# Patient Record
Sex: Male | Born: 1970
Health system: Southern US, Community
[De-identification: ages and names within clinical notes are randomized; demographics above are authoritative.]

## PROBLEM LIST (undated history)

## (undated) DIAGNOSIS — E291 Testicular hypofunction: Secondary | ICD-10-CM

## (undated) DIAGNOSIS — K5900 Constipation, unspecified: Secondary | ICD-10-CM

## (undated) DIAGNOSIS — E7800 Pure hypercholesterolemia, unspecified: Secondary | ICD-10-CM

## (undated) DIAGNOSIS — M549 Dorsalgia, unspecified: Secondary | ICD-10-CM

## (undated) DIAGNOSIS — I10 Essential (primary) hypertension: Secondary | ICD-10-CM

## (undated) DIAGNOSIS — F419 Anxiety disorder, unspecified: Secondary | ICD-10-CM

## (undated) DIAGNOSIS — K5790 Diverticulosis of intestine, part unspecified, without perforation or abscess without bleeding: Secondary | ICD-10-CM

## (undated) DIAGNOSIS — G4730 Sleep apnea, unspecified: Secondary | ICD-10-CM

## (undated) DIAGNOSIS — K648 Other hemorrhoids: Secondary | ICD-10-CM

## (undated) DIAGNOSIS — R609 Edema, unspecified: Secondary | ICD-10-CM

## (undated) HISTORY — DX: Essential (primary) hypertension: I10

## (undated) HISTORY — DX: Sleep apnea, unspecified: G47.30

## (undated) HISTORY — DX: Testicular hypofunction: E29.1

## (undated) HISTORY — DX: Edema, unspecified: R60.9

## (undated) HISTORY — DX: Dorsalgia, unspecified: M54.9

## (undated) HISTORY — DX: Constipation, unspecified: K59.00

## (undated) HISTORY — DX: Diverticulosis of intestine, part unspecified, without perforation or abscess without bleeding: K57.90

## (undated) HISTORY — PX: NO PAST SURGERIES: SHX2092

## (undated) HISTORY — DX: Pure hypercholesterolemia, unspecified: E78.00

## (undated) HISTORY — DX: Other hemorrhoids: K64.8

---

## 2015-11-19 DIAGNOSIS — E559 Vitamin D deficiency, unspecified: Secondary | ICD-10-CM | POA: Diagnosis not present

## 2015-11-19 DIAGNOSIS — I10 Essential (primary) hypertension: Secondary | ICD-10-CM | POA: Diagnosis not present

## 2015-11-19 DIAGNOSIS — B3782 Candidal enteritis: Secondary | ICD-10-CM | POA: Diagnosis not present

## 2015-11-19 DIAGNOSIS — E7210 Disorders of sulfur-bearing amino-acid metabolism, unspecified: Secondary | ICD-10-CM | POA: Diagnosis not present

## 2015-12-17 DIAGNOSIS — R5383 Other fatigue: Secondary | ICD-10-CM | POA: Diagnosis not present

## 2015-12-17 DIAGNOSIS — E291 Testicular hypofunction: Secondary | ICD-10-CM | POA: Diagnosis not present

## 2015-12-17 DIAGNOSIS — I10 Essential (primary) hypertension: Secondary | ICD-10-CM | POA: Diagnosis not present

## 2016-02-19 DIAGNOSIS — E291 Testicular hypofunction: Secondary | ICD-10-CM | POA: Diagnosis not present

## 2016-02-19 DIAGNOSIS — K589 Irritable bowel syndrome without diarrhea: Secondary | ICD-10-CM | POA: Diagnosis not present

## 2016-02-19 DIAGNOSIS — I10 Essential (primary) hypertension: Secondary | ICD-10-CM | POA: Diagnosis not present

## 2016-04-07 ENCOUNTER — Encounter (HOSPITAL_BASED_OUTPATIENT_CLINIC_OR_DEPARTMENT_OTHER): Payer: Self-pay | Admitting: *Deleted

## 2016-04-07 ENCOUNTER — Emergency Department (HOSPITAL_BASED_OUTPATIENT_CLINIC_OR_DEPARTMENT_OTHER)
Admission: EM | Admit: 2016-04-07 | Discharge: 2016-04-07 | Disposition: A | Discharge status: Home / Self Care | Payer: BLUE CROSS/BLUE SHIELD | Attending: Emergency Medicine | Admitting: Emergency Medicine

## 2016-04-07 ENCOUNTER — Emergency Department (HOSPITAL_BASED_OUTPATIENT_CLINIC_OR_DEPARTMENT_OTHER): Payer: BLUE CROSS/BLUE SHIELD

## 2016-04-07 DIAGNOSIS — R0602 Shortness of breath: Secondary | ICD-10-CM | POA: Diagnosis not present

## 2016-04-07 DIAGNOSIS — R079 Chest pain, unspecified: Secondary | ICD-10-CM | POA: Insufficient documentation

## 2016-04-07 DIAGNOSIS — R200 Anesthesia of skin: Secondary | ICD-10-CM | POA: Insufficient documentation

## 2016-04-07 DIAGNOSIS — I10 Essential (primary) hypertension: Secondary | ICD-10-CM | POA: Diagnosis not present

## 2016-04-07 DIAGNOSIS — R42 Dizziness and giddiness: Secondary | ICD-10-CM | POA: Diagnosis not present

## 2016-04-07 DIAGNOSIS — I517 Cardiomegaly: Secondary | ICD-10-CM | POA: Diagnosis not present

## 2016-04-07 DIAGNOSIS — Z79899 Other long term (current) drug therapy: Secondary | ICD-10-CM | POA: Insufficient documentation

## 2016-04-07 DIAGNOSIS — R000 Tachycardia, unspecified: Secondary | ICD-10-CM | POA: Diagnosis not present

## 2016-04-07 DIAGNOSIS — R0789 Other chest pain: Secondary | ICD-10-CM | POA: Diagnosis not present

## 2016-04-07 HISTORY — DX: Anxiety disorder, unspecified: F41.9

## 2016-04-07 HISTORY — DX: Essential (primary) hypertension: I10

## 2016-04-07 LAB — COMPREHENSIVE METABOLIC PANEL
ALBUMIN: 4.4 g/dL (ref 3.5–5.0)
ALK PHOS: 62 U/L (ref 38–126)
ALT: 11 U/L — ABNORMAL LOW (ref 17–63)
ANION GAP: 10 (ref 5–15)
AST: 23 U/L (ref 15–41)
BUN: 27 mg/dL — ABNORMAL HIGH (ref 6–20)
CO2: 25 mmol/L (ref 22–32)
Calcium: 9.1 mg/dL (ref 8.9–10.3)
Chloride: 104 mmol/L (ref 101–111)
Creatinine, Ser: 0.99 mg/dL (ref 0.61–1.24)
GFR calc Af Amer: >60 mL/min (ref >60)
GFR calc non Af Amer: >60 mL/min (ref >60)
Glucose, Bld: 115 mg/dL — ABNORMAL HIGH (ref 65–99)
POTASSIUM: 3.7 mmol/L (ref 3.5–5.1)
SODIUM: 139 mmol/L (ref 135–145)
Total Bilirubin: 2.4 mg/dL — ABNORMAL HIGH (ref 0.3–1.2)
Total Protein: 7.3 g/dL (ref 6.5–8.1)

## 2016-04-07 LAB — CBC
HCT: 47.7 % (ref 39.0–52.0)
HEMOGLOBIN: 16.1 g/dL (ref 13.0–17.0)
MCH: 31.3 pg (ref 26.0–34.0)
MCHC: 33.8 g/dL (ref 30.0–36.0)
MCV: 92.6 fL (ref 78.0–100.0)
Platelets: 204 10*3/uL (ref 150–400)
RBC: 5.15 MIL/uL (ref 4.22–5.81)
RDW: 12.4 % (ref 11.5–15.5)
WBC: 9.8 10*3/uL (ref 4.0–10.5)

## 2016-04-07 LAB — D-DIMER, QUANTITATIVE: D-Dimer, Quant: 0.32 ug/mL-FEU (ref 0.00–0.50)

## 2016-04-07 LAB — TROPONIN I

## 2016-04-07 NOTE — ED Provider Notes (Signed)
[place] [facility] Provider Note   CSN: [id number] Arrival date & time: 04/07/16  0920     History   Chief Complaint Chief Complaint  Patient presents with  . Chest Pain    HPI Kevin Newman is a 46 y.o. male.  Patient referred in from urgent care for a one-week history of left-sided chest pain that radiates to the left arm with some numbness. Not made worse by movement or exertion. No history of similar pain. Pain is present at all times. Associated with some lightheadedness but no nausea no vomiting. Also some mild shortness of breath.      Past Medical History:  Diagnosis Date  . Anxiety   . Hypertension     There are no active problems to display for this patient.   History reviewed. No pertinent surgical history.     Home Medications    Prior to Admission medications   Medication Sig Start Date End Date Taking? Authorizing Provider  amLODipine (NORVASC) 5 MG tablet Take 5 mg by mouth daily.   Yes Historical Provider, MD    Family History History reviewed. No pertinent family history.  Social History Social History  Substance Use Topics  . Smoking status: Never Smoker  . Smokeless tobacco: Not on file  . Alcohol use No     Allergies   Amoxil [amoxicillin] and Sulfa antibiotics   Review of Systems Review of Systems  Constitutional: Negative for fever.  HENT: Negative for congestion.   Eyes: Negative for redness.  Respiratory: Positive for shortness of breath.   Cardiovascular: Positive for chest pain.  Gastrointestinal: Negative for abdominal pain.  Genitourinary: Negative for dysuria.  Musculoskeletal: Negative for back pain.  Skin: Negative for rash.  Neurological: Positive for light-headedness and numbness.  Hematological: Does not bruise/bleed easily.  Psychiatric/Behavioral: Negative for confusion.     Physical Exam Updated Vital Signs BP 121/89   Pulse 96   Temp 98.5 F (36.9 C) (Oral)   Resp 24   Ht 5\' 8"  (1.727 m)    Wt 110.2 kg   SpO2 96%   BMI 36.95 kg/m   Physical Exam  Constitutional: He is oriented to person, place, and time. He appears well-developed and well-nourished. No distress.  HENT:  Head: Normocephalic and atraumatic.  Mouth/Throat: Oropharynx is clear and moist.  Eyes: Conjunctivae and EOM are normal. Pupils are equal, round, and reactive to light.  Neck: Normal range of motion. Neck supple.  Cardiovascular: Normal rate, regular rhythm and normal heart sounds.   Pulmonary/Chest: Effort normal and breath sounds normal. No respiratory distress. He exhibits no tenderness.  Abdominal: Soft. Bowel sounds are normal. There is no tenderness.  Musculoskeletal: Normal range of motion.  Neurological: He is alert and oriented to person, place, and time. No cranial nerve deficit or sensory deficit. He exhibits normal muscle tone. Coordination normal.  Skin: Skin is warm.  Nursing note and vitals reviewed.    ED Treatments / Results  Labs (all labs ordered are listed, but only abnormal results are displayed) Labs Reviewed  COMPREHENSIVE METABOLIC PANEL - Abnormal; Notable for the following:       Result Value   Glucose, Bld 115 (*)    BUN 27 (*)    ALT 11 (*)    Total Bilirubin 2.4 (*)    All other components within normal limits  TROPONIN I  CBC  D-DIMER, QUANTITATIVE (NOT AT [facility])   Results for orders placed or performed during the hospital encounter of 04/07/16  Troponin I  Result Value Ref Range   Troponin I <0.03 <0.03 ng/mL  CBC  Result Value Ref Range   WBC 9.8 4.0 - 10.5 K/uL   RBC 5.15 4.22 - 5.81 MIL/uL   Hemoglobin 16.1 13.0 - 17.0 g/dL   HCT 47.7 39.0 - 52.0 %   MCV 92.6 78.0 - 100.0 fL   MCH 31.3 26.0 - 34.0 pg   MCHC 33.8 30.0 - 36.0 g/dL   RDW 12.4 11.5 - 15.5 %   Platelets 204 150 - 400 K/uL  Comprehensive metabolic panel  Result Value Ref Range   Sodium 139 135 - 145 mmol/L   Potassium 3.7 3.5 - 5.1 mmol/L   Chloride 104 101 - 111 mmol/L   CO2 25 22 -  32 mmol/L   Glucose, Bld 115 (H) 65 - 99 mg/dL   BUN 27 (H) 6 - 20 mg/dL   Creatinine, Ser 0.99 0.61 - 1.24 mg/dL   Calcium 9.1 8.9 - 10.3 mg/dL   Total Protein 7.3 6.5 - 8.1 g/dL   Albumin 4.4 3.5 - 5.0 g/dL   AST 23 15 - 41 U/L   ALT 11 (L) 17 - 63 U/L   Alkaline Phosphatase 62 38 - 126 U/L   Total Bilirubin 2.4 (H) 0.3 - 1.2 mg/dL   GFR calc non Af Amer >60 >60 mL/min   GFR calc Af Amer >60 >60 mL/min   Anion gap 10 5 - 15  D-dimer, quantitative (not at [facility])  Result Value Ref Range   D-Dimer, Quant 0.32 0.00 - 0.50 ug/mL-FEU    EKG  EKG Interpretation  Date/Time:  Thursday April 07 2016 09:34:11 EST Ventricular Rate:  110 PR Interval:    QRS Duration: 94 QT Interval:  334 QTC Calculation: 452 R Axis:   -10 Text Interpretation:  Sinus tachycardia Left ventricular hypertrophy Inferior infarct, old No previous ECGs available Confirmed by Grace Haggart  MD, Salar Molden [phone]) on 04/07/2016 9:41:54 AM       Radiology Dg Chest 2 View  Result Date: 04/07/2016 CLINICAL DATA:  One week of chest pain, history of hypertension, nonsmoker. EXAM: CHEST  2 VIEW COMPARISON:  None in PACs FINDINGS: The lungs are adequately inflated. There is no focal infiltrate. There are increased linear lung markings anteriorly and laterally at the left lung base. There is no pleural effusion or pneumothorax. The heart and pulmonary vascularity are normal. The mediastinum is normal in width. The bony thorax exhibits no acute abnormality. IMPRESSION: Subsegmental atelectasis or scarring in the lingula. No alveolar pneumonia nor CHF. Follow-up radiographs following anticipated antibiotic therapy are recommended. If no therapy is planned, a follow-up chest x-ray is recommended unless the patient's symptoms completely resolve. Electronically Signed   By: David  Martinique M.D.   On: 04/07/2016 09:53   Ct Chest Wo Contrast  Result Date: 04/07/2016 CLINICAL DATA:  Left sided chest pain which radiates to left arm with  nausea only x 1 week, Hx: HTN. EXAM: CT CHEST WITHOUT CONTRAST TECHNIQUE: Multidetector CT imaging of the chest was performed following the standard protocol without IV contrast. COMPARISON:  04/07/2016 FINDINGS: Cardiovascular: No significant vascular findings. Normal heart size. No pericardial effusion.SFU Mediastinum/Nodes: No enlarged mediastinal or axillary lymph nodes. Thyroid gland, trachea, and esophagus demonstrate no significant findings. Lungs/Pleura: Minimal bibasilar subsegmental atelectasis. No focal consolidations or pleural effusions. No pulmonary edema. Upper Abdomen: No acute abnormality. Musculoskeletal: No chest wall mass or suspicious bone lesions identified. IMPRESSION: No evidence for acute  abnormality. Electronically Signed  By: Nolon Nations M.D.   On: 04/07/2016 11:18    Procedures Procedures (including critical care time)  Medications Ordered in ED Medications - No data to display   Initial Impression / Assessment and Plan / ED Course  I have reviewed the triage vital signs and the nursing notes.  Pertinent labs & imaging results that were available during my care of the patient were reviewed by me and considered in my medical decision making (see chart for details).    Workup for the left-sided chest pain without any acute findings. Chest CT was negative screening tests for blood clots in the lungs was negative. EKG just shows a sinus tachycardia. Would recommend follow-up with cardiology starting a baby aspirin a day and thyroid function testing.   Final Clinical Impressions(s) / ED Diagnoses   Final diagnoses:  Chest pain, unspecified type    New Prescriptions New Prescriptions   No medications on file     Fredia Sorrow, MD 04/07/16 1233

## 2016-04-07 NOTE — Discharge Instructions (Signed)
Today's workup for the chest pain without evidence of any acute cardiac event. No evidence of any blood clots in the lungs. And lungs were normal. However he had had a persistent tachycardia would recommend thyroid study testing and also would recommend follow-up with cardiology. Referral provided. Also recommend taking a baby aspirin a day. Return for any new or worse symptoms.

## 2016-04-07 NOTE — ED Triage Notes (Signed)
Pt c/o left sided chest pain which radiates to left arm with nausea only x 1 week, sent here from [facility] for eval

## 2016-04-21 ENCOUNTER — Ambulatory Visit: Payer: BLUE CROSS/BLUE SHIELD | Admitting: Physician Assistant

## 2016-04-22 ENCOUNTER — Ambulatory Visit (INDEPENDENT_AMBULATORY_CARE_PROVIDER_SITE_OTHER): Payer: BLUE CROSS/BLUE SHIELD | Admitting: Family Medicine

## 2016-04-22 ENCOUNTER — Encounter: Payer: Self-pay | Admitting: Family Medicine

## 2016-04-22 ENCOUNTER — Telehealth: Payer: Self-pay | Admitting: Family Medicine

## 2016-04-22 ENCOUNTER — Other Ambulatory Visit: Payer: Self-pay | Admitting: Family Medicine

## 2016-04-22 VITALS — BP 134/93 | HR 113 | Temp 97.9°F | Ht 68.0 in | Wt 255.4 lb

## 2016-04-22 DIAGNOSIS — R000 Tachycardia, unspecified: Secondary | ICD-10-CM

## 2016-04-22 DIAGNOSIS — I10 Essential (primary) hypertension: Secondary | ICD-10-CM

## 2016-04-22 DIAGNOSIS — Z13220 Encounter for screening for lipoid disorders: Secondary | ICD-10-CM | POA: Diagnosis not present

## 2016-04-22 DIAGNOSIS — Z125 Encounter for screening for malignant neoplasm of prostate: Secondary | ICD-10-CM

## 2016-04-22 DIAGNOSIS — R7989 Other specified abnormal findings of blood chemistry: Secondary | ICD-10-CM

## 2016-04-22 DIAGNOSIS — Z114 Encounter for screening for human immunodeficiency virus [HIV]: Secondary | ICD-10-CM

## 2016-04-22 HISTORY — DX: Essential (primary) hypertension: I10

## 2016-04-22 LAB — LIPID PANEL
CHOLESTEROL: 161 mg/dL (ref 0–200)
HDL: 47.5 mg/dL (ref >39.00)
LDL CALC: 83 mg/dL (ref 0–99)
NonHDL: 113.26
Total CHOL/HDL Ratio: 3
Triglycerides: 149 mg/dL (ref 0.0–149.0)
VLDL: 29.8 mg/dL (ref 0.0–40.0)

## 2016-04-22 LAB — TSH: TSH: 0.01 u[IU]/mL — AB (ref 0.35–4.50)

## 2016-04-22 LAB — PSA: PSA: 0.64 ng/mL (ref 0.10–4.00)

## 2016-04-22 LAB — HIV ANTIBODY (ROUTINE TESTING W REFLEX): HIV: NONREACTIVE

## 2016-04-22 MED ORDER — CHLORTHALIDONE 25 MG PO TABS: 25.0000 mg | ORAL_TABLET | Freq: Every day | ORAL | 1 refills | Status: DC

## 2016-04-22 NOTE — Progress Notes (Signed)
Pre visit review using our clinic review tool, if applicable. No additional management support is needed unless otherwise documented below in the visit note. 

## 2016-04-22 NOTE — Telephone Encounter (Signed)
Faxed to request medical records from [facility].

## 2016-04-22 NOTE — Patient Instructions (Signed)
Aim to do some physical exertion for 150 minutes per week. This is typically divided into 5 days per week, 30 minutes per day. The activity should be enough to get your heart rate up. Anything is better than nothing if you have time constraints.  Around 3 times per week, check your blood pressure 4 times per day. Twice in the morning and twice in the evening. The readings should be at least one minute apart. Write down these values and bring them to your next nurse visit/appointment.  When you check your BP, make sure you have been doing something calm/relaxing 5 minutes prior to checking. Both feet should be flat on the floor and you should be sitting. Use your left arm and make sure it is in a relaxed position (on a table), and that the cuff is at the approximate level/height of your heart.

## 2016-04-22 NOTE — Progress Notes (Signed)
Chief Complaint  Patient presents with  . Establish Care    Pt reports issues with BP, ER visit for LT arm pain with tightness unsure if related to elevated BP       New Patient Visit SUBJECTIVE: HPI: Kevin Newman is an 46 y.o.male who is being seen for establishing care.z  The patient was previously seen at an office being closed down.  Hypertension Patient presents for hypertension follow up. He does monitor home blood pressures. Blood pressures ranging on average from 130's/90's. He is compliant with medications- Norvasc 5 mg daily, HCTZ 25 mg daily. Lisinopril (didn't work and had cough), Losartan (didn't work) Patient has these side effects of medication: none He is sometimes adhering to a healthy diet overall. Exercise: None  L side of chest was constant since end of Jan. Resolved around 4 days ago. Described as a tightness with associated numbness in L arm. Denies palpitations, SOB, jaw pain, arm pain, back pain.  Massage made it better. Nothing, other than a massage, made it better. Nothing made it worse. He has never smoked. No other medhx other than HTN. He has no famhx of heart disease outside of mgm who had issues in her 62's.    Allergies  Allergen Reactions  . Amoxil [Amoxicillin]   . Sulfa Antibiotics     Past Medical History:  Diagnosis Date  . Anxiety   . Essential hypertension 04/22/2016   Past Surgical History:  Procedure Laterality Date  . NO PAST SURGERIES     Social History   Social History  . Marital status: Divorced   Social History Main Topics  . Smoking status: Never Smoker  . Smokeless tobacco: Never Used  . Alcohol use No  . Drug use: No  . Sexual activity: No   Family History  Problem Relation Age of Onset  . Heart disease Maternal Grandmother 80     Current Outpatient Prescriptions:  .  amLODipine (NORVASC) 5 MG tablet, Take 5 mg by mouth daily., Disp: , Rfl:  .  anastrozole (ARIMIDEX) 1 MG tablet, Take 1 tablet by mouth 2  (two) times a week., Disp: , Rfl:  .  Cholecalciferol (VITAMIN D-3 PO), Take by mouth., Disp: , Rfl:  .  Cobalamine Combinations (B12 FOLATE) 800-800 MCG CAPS, Take 800 mcg by mouth daily., Disp: , Rfl:  .  Coenzyme Q10 (COQ10) 100 MG CAPS, Take 100 mg by mouth daily., Disp: , Rfl:  .  Dexchlorphen-PSE-Methscop (D-HIST D PO), Take by mouth., Disp: , Rfl:  .  DHEA 25 MG CAPS, Take by mouth., Disp: , Rfl:  .  Garlic [id number] MG TABS, Take 500 mg by mouth daily., Disp: , Rfl:  .  Magnesium 400 MG TABS, Take 400 mg by mouth 2 (two) times daily., Disp: , Rfl:  .  Methylcobalamin (METHYL B-12 PO), Take by mouth., Disp: , Rfl:  .  Multiple Vitamins-Minerals (MULTIVITAMIN ADULT PO), Take by mouth., Disp: , Rfl:  .  Taurine 1000 MG CAPS, Take 2,000 mg by mouth 3 (three) times daily., Disp: , Rfl:  .  TESTOSTERONE TD, Place onto the skin. 300mg /cc, Disp: , Rfl:  .  vitamin E 1000 UNIT capsule, Take 1,000 Units by mouth daily., Disp: , Rfl:  .  chlorthalidone (HYGROTON) 25 MG tablet, Take 1 tablet (25 mg total) by mouth daily., Disp: 90 tablet, Rfl: 1   ROS Cardiovascular: Denies current chest pain  Respiratory: Denies dyspnea   OBJECTIVE: BP (!) 134/93 (BP Location: Left Arm, Patient  Position: Sitting, Cuff Size: Large)   Pulse (!) 113   Temp 97.9 F (36.6 C) (Oral)   Ht 5\' 8"  (1.727 m)   Wt 255 lb 6.4 oz (115.8 kg)   SpO2 98%   BMI 38.83 kg/m   Constitutional: -  VS reviewed -  Well developed, well nourished, appears stated age -  No apparent distress  Psychiatric: -  Oriented to person, place, and time -  Memory intact -  Affect and mood normal -  Fluent conversation, good eye contact -  Judgment and insight age appropriate  Eye: -  Conjunctivae clear, no discharge -  Pupils symmetric, round, reactive to light  ENMT: -  Oral mucosa without lesions, tongue and uvula midline    Tonsils not enlarged, no erythema, no exudate, trachea midline    Pharynx moist, no lesions, no erythema  Neck:  -  No gross swelling, no palpable masses -  Thyroid midline, not enlarged, mobile, no palpable masses  Cardiovascular: -  RRR, no murmurs -  No LE edema -  No bruits  Respiratory: -  Normal respiratory effort, no accessory muscle use, no retraction -  Breath sounds equal, no wheezes, no ronchi, no crackles  Neurological:  -  CN II - XII grossly intact -  Sensation grossly intact to light touch, equal bilaterally  Musculoskeletal: -  No clubbing, no cyanosis -  Gait normal -  No reproduction of pain with palpation  Skin: -  No significant lesion on inspection -  Warm and dry to palpation   ASSESSMENT/PLAN: Essential hypertension - Plan: chlorthalidone (HYGROTON) 25 MG tablet  Tachycardia - Plan: TSH  Screening cholesterol level - Plan: Lipid panel  Encounter for screening for HIV - Plan: HIV antibody  Screening for prostate cancer - Plan: PSA  Patient instructed to sign release of records form from his previous PCP. Change HCTZ to chlorthalidone. Pt has f/u with cardiology next week. Counseled on diet and exercise. Discussed home monitoring of BP. Patient should return in 1 mo to recheck BP. If no improvement, could add Metoprolol vs trying an ARB again. The patient voiced understanding and agreement to the plan.   [place], DO 04/22/16  9:59 AM

## 2016-04-26 NOTE — Progress Notes (Signed)
Cardiology Office Note    Date:  04/27/2016   ID:  Kevin Newman, DOB [birth year], MRN [id number]  PCP:  Kevin Pal, DO  Cardiologist:  New  Chief Complaint: Chest pain   History of Present Illness:   Kevin Newman is a 46 y.o. male with hx of HTN Presents for evaluation of left-sided chest pain.  Patient has a ongoing left upper chest pain since beginning of this month. He describes as tightness. Radiated to left upper arm. No associated dyspnea, nausea, vomiting. This pain was constant leading to evaluation in ER 04/07/16. EKG shows sinus tachycardia at rate of 110 beats per minutes (personally reviewed). No prior EKG to compare. Troponin and d-dimer was normal. Advised to follow-up with cardiologist and PCP --> discharged.   Seen by PCP to establish care. Changed HCTZ to chlorthalidone. Prior hx of Lisinopril (didn't work and had cough), Losartan (didn't work).  TSH is low, pending free T4. Followed by PCP. LDL 83.   Here today for further evaluation. Now his chest tightness is intermittent for the past 1 weeks. Occurs with and without exertion. 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 Also has intermittent palpitations lasting for 5-10 minutes, mostly occurs at night. No syncope or dizziness. Denies orthopnea, PND, syncope, LE edema.   Non smoker. Social drinker. No family hx of early CAD. Echo about 10 years ago normal. It was done when he was dx with HTN.     Past Medical History:  Diagnosis Date  . Anxiety   . Essential hypertension 04/22/2016    Past Surgical History:  Procedure Laterality Date  . NO PAST SURGERIES      Current Medications: Prior to Admission medications   Medication Sig Start Date End Date Taking? Authorizing Provider  amLODipine (NORVASC) 5 MG tablet Take 5 mg by mouth daily.    Historical Provider, MD  anastrozole (ARIMIDEX) 1 MG tablet Take 1 tablet by mouth 2 (two) times a week. 03/30/16   Historical Provider, MD   chlorthalidone (HYGROTON) 25 MG tablet Take 1 tablet (25 mg total) by mouth daily. 04/22/16   Kevin Pal, DO  Cholecalciferol (VITAMIN D-3 PO) Take by mouth.    Historical Provider, MD  Cobalamine Combinations (B12 FOLATE) 800-800 MCG CAPS Take 800 mcg by mouth daily.    Historical Provider, MD  Coenzyme Q10 (COQ10) 100 MG CAPS Take 100 mg by mouth daily.    Historical Provider, MD  Dexchlorphen-PSE-Methscop (D-HIST D PO) Take by mouth.    Historical Provider, MD  DHEA 25 MG CAPS Take by mouth.    Historical Provider, MD  Garlic [id number] MG TABS Take 500 mg by mouth daily.    Historical Provider, MD  Magnesium 400 MG TABS Take 400 mg by mouth 2 (two) times daily.    Historical Provider, MD  Methylcobalamin (METHYL B-12 PO) Take by mouth.    Historical Provider, MD  Multiple Vitamins-Minerals (MULTIVITAMIN ADULT PO) Take by mouth.    Historical Provider, MD  Taurine 1000 MG CAPS Take 2,000 mg by mouth 3 (three) times daily.    Historical Provider, MD  TESTOSTERONE TD Place onto the skin. 300mg /cc    Historical Provider, MD  vitamin E 1000 UNIT capsule Take 1,000 Units by mouth daily.    Historical Provider, MD    Allergies:   Amoxil [amoxicillin] and Sulfa antibiotics   Social History   Social History  . Marital status: Divorced    Spouse name: N/A  .  Number of children: N/A  . Years of education: N/A   Social History Main Topics  . Smoking status: Never Smoker  . Smokeless tobacco: Never Used  . Alcohol use No  . Drug use: No  . Sexual activity: No   Other Topics Concern  . None   Social History Narrative  . None     Family History:  The patient's family history includes Heart disease (age of onset: 48) in his maternal grandmother.   ROS:   Please see the history of present illness.    ROS All other systems reviewed and are negative.   PHYSICAL EXAM:   VS:  BP 136/88   Pulse (!) 112   Ht 5\' 8"  (1.727 m)   Wt 252 lb (114.3 kg)   SpO2 95%   BMI 38.32 kg/m      GEN: morbidly obese male in no acute distress  HEENT: normal  Neck: no JVD, carotid bruits, or masses Cardiac: RRR; no murmurs, rubs, or gallops,no edema  Respiratory:  clear to auscultation bilaterally, normal work of breathing GI: soft, nontender, nondistended, + BS MS: no deformity or atrophy  Skin: warm and dry, no rash Neuro:  Alert and Oriented x 3, Strength and sensation are intact Psych: euthymic mood, full affect  Wt Readings from Last 3 Encounters:  04/27/16 252 lb (114.3 kg)  04/22/16 255 lb 6.4 oz (115.8 kg)  04/07/16 243 lb (110.2 kg)      Studies/Labs Reviewed:   EKG:  EKG is ordered today.  The ekg ordered today demonstrates sinus tachycardia at rate of 112 bpm.   Recent Labs: 04/07/2016: ALT 11; BUN 27; Creatinine, Ser 0.99; Hemoglobin 16.1; Platelets 204; Potassium 3.7; Sodium 139 04/22/2016: TSH 0.01   Lipid Panel    Component Value Date/Time   CHOL 161 04/22/2016 0914   TRIG 149.0 04/22/2016 0914   HDL 47.50 04/22/2016 0914   CHOLHDL 3 04/22/2016 0914   VLDL 29.8 04/22/2016 0914   LDLCALC 83 04/22/2016 0914    Additional studies/ records that were reviewed today include:   As above    ASSESSMENT & PLAN:    1. Chest pain - Seems atypical. Initially constant for 2 1/2 weeks and now its intermittent. Described as tightness and radiation to L upper arm. Will get exercise Myoview. Trial of OTC PPI for possible GERD.   2. Sinus tachycardia/palpitations - likely due to abnormal thyroid level. Followed by PCP. He has repeat labs tomorrow. If normal functioning thyroid level--> will need BB and monitor. Will get echo.   3. HTN - Stable and well controlled on current medications.   Discussed plan with DOD Dr. [name]. If abnormal above result, he will f/u with general cardiologist.     Medication Adjustments/Labs and Tests Ordered: Current medicines are reviewed at length with the patient today.  Concerns regarding medicines are outlined above.   Medication changes, Labs and Tests ordered today are listed in the Patient Instructions below. Patient Instructions  Medication Instructions:  Your physician recommends that you continue on your current medications as directed. Please refer to the Current Medication list given to you today.   Labwork: None ordered  Testing/Procedures: Your physician has requested that you have an echocardiogram. Echocardiography is a painless test that uses sound waves to create images of your heart. It provides your doctor with information about the size and shape of your heart and how well your heart's chambers and valves are working. This procedure takes approximately one hour. There  are no restrictions for this procedure.  Your physician has requested that you have en exercise stress myoview. For further information please visit [email or web address]. Please follow instruction sheet, as given.    Follow-Up: Your physician recommends that you schedule a follow-up appointment in: 2-3 weeks with Robbie Lis, PA-C  Any Other Special Instructions Will Be Listed Below (If Applicable).     If you need a refill on your cardiac medications before your next appointment, please call your pharmacy.      Jarrett Soho, [place]  04/27/2016 8:40 AM    [address], [place],   [id number] Phone: [phone]; Fax: [phone]

## 2016-04-27 ENCOUNTER — Encounter: Payer: Self-pay | Admitting: Physician Assistant

## 2016-04-27 ENCOUNTER — Ambulatory Visit (INDEPENDENT_AMBULATORY_CARE_PROVIDER_SITE_OTHER): Payer: BLUE CROSS/BLUE SHIELD | Admitting: Physician Assistant

## 2016-04-27 VITALS — BP 136/88 | HR 112 | Ht 68.0 in | Wt 252.0 lb

## 2016-04-27 DIAGNOSIS — R002 Palpitations: Secondary | ICD-10-CM

## 2016-04-27 DIAGNOSIS — R946 Abnormal results of thyroid function studies: Secondary | ICD-10-CM

## 2016-04-27 DIAGNOSIS — R000 Tachycardia, unspecified: Secondary | ICD-10-CM

## 2016-04-27 DIAGNOSIS — R7989 Other specified abnormal findings of blood chemistry: Secondary | ICD-10-CM

## 2016-04-27 DIAGNOSIS — I10 Essential (primary) hypertension: Secondary | ICD-10-CM | POA: Diagnosis not present

## 2016-04-27 DIAGNOSIS — R079 Chest pain, unspecified: Secondary | ICD-10-CM

## 2016-04-27 NOTE — Patient Instructions (Signed)
Medication Instructions:  Your physician recommends that you continue on your current medications as directed. Please refer to the Current Medication list given to you today.   Labwork: None ordered  Testing/Procedures: Your physician has requested that you have an echocardiogram. Echocardiography is a painless test that uses sound waves to create images of your heart. It provides your doctor with information about the size and shape of your heart and how well your heart's chambers and valves are working. This procedure takes approximately one hour. There are no restrictions for this procedure.  Your physician has requested that you have en exercise stress myoview. For further information please visit [email or web address]. Please follow instruction sheet, as given.    Follow-Up: Your physician recommends that you schedule a follow-up appointment in: 2-3 weeks with Robbie Lis, PA-C  Any Other Special Instructions Will Be Listed Below (If Applicable).     If you need a refill on your cardiac medications before your next appointment, please call your pharmacy.

## 2016-04-28 ENCOUNTER — Other Ambulatory Visit (INDEPENDENT_AMBULATORY_CARE_PROVIDER_SITE_OTHER): Payer: BLUE CROSS/BLUE SHIELD

## 2016-04-28 DIAGNOSIS — R946 Abnormal results of thyroid function studies: Secondary | ICD-10-CM | POA: Diagnosis not present

## 2016-04-28 DIAGNOSIS — R7989 Other specified abnormal findings of blood chemistry: Secondary | ICD-10-CM

## 2016-04-28 LAB — TSH: TSH: 0.01 u[IU]/mL — AB (ref 0.35–4.50)

## 2016-04-28 LAB — T4, FREE: FREE T4: 1.71 ng/dL — AB (ref 0.60–1.60)

## 2016-05-04 ENCOUNTER — Encounter: Payer: Self-pay | Admitting: Family Medicine

## 2016-05-04 ENCOUNTER — Ambulatory Visit (INDEPENDENT_AMBULATORY_CARE_PROVIDER_SITE_OTHER): Payer: BLUE CROSS/BLUE SHIELD | Admitting: Family Medicine

## 2016-05-04 VITALS — BP 110/82 | HR 103 | Temp 98.1°F | Resp 16 | Ht 68.0 in | Wt 251.2 lb

## 2016-05-04 DIAGNOSIS — R109 Unspecified abdominal pain: Secondary | ICD-10-CM | POA: Diagnosis not present

## 2016-05-04 DIAGNOSIS — E0590 Thyrotoxicosis, unspecified without thyrotoxic crisis or storm: Secondary | ICD-10-CM

## 2016-05-04 DIAGNOSIS — R195 Other fecal abnormalities: Secondary | ICD-10-CM | POA: Diagnosis not present

## 2016-05-04 MED ORDER — METHIMAZOLE 5 MG PO TABS: 5.0000 mg | ORAL_TABLET | Freq: Three times a day (TID) | ORAL | 2 refills | Status: DC

## 2016-05-04 NOTE — Progress Notes (Signed)
Chief Complaint  Patient presents with  . Hypothyroidism    pt states he's here to discuss Thyroid levels.    Subjective: Patient is a 46 y.o. male here for discussion of thyroid levels.  Found to be elevated on 2 different readings. Pt does have some tachycardia, palpitations, and loose/frequent stools. He's never been told he is a thyroid issue before. His mother does have a history of thyroid disease. He is unsure what exactly she had. Ice heat/cold intolerance, weight changes, skin changes, hair changes, or fatigue.  The patient is quite concerned with his abdominal pain. He realizes it may be due to his thyroid levels. He was told by a previous physician to keep a food diary. He has not been diligent with this. He does not chew a lot of sugar-free gum. He denies any bleeding outside of hemorrhoids, weight loss, nighttime awakenings, or fevers.   ROS: Heart: +Palpitations  GI: as noted in HPI  Family History  Problem Relation Age of Onset  . Heart disease Maternal Grandmother 80   Past Medical History:  Diagnosis Date  . Anxiety   . Essential hypertension 04/22/2016   Allergies  Allergen Reactions  . Amoxil [Amoxicillin]   . Sulfa Antibiotics     Current Outpatient Prescriptions:  .  amLODipine (NORVASC) 5 MG tablet, Take 5 mg by mouth daily., Disp: , Rfl:  .  anastrozole (ARIMIDEX) 1 MG tablet, Take 1 tablet by mouth 2 (two) times a week., Disp: , Rfl:  .  chlorthalidone (HYGROTON) 25 MG tablet, Take 1 tablet (25 mg total) by mouth daily., Disp: 90 tablet, Rfl: 1 .  Cholecalciferol (VITAMIN D-3 PO), Take by mouth., Disp: , Rfl:  .  Cobalamine Combinations (B12 FOLATE) 800-800 MCG CAPS, Take 800 mcg by mouth daily., Disp: , Rfl:  .  Coenzyme Q10 (COQ10) 100 MG CAPS, Take 100 mg by mouth daily., Disp: , Rfl:  .  Dexchlorphen-PSE-Methscop (D-HIST D PO), Take by mouth., Disp: , Rfl:  .  DHEA 25 MG CAPS, Take by mouth., Disp: , Rfl:  .  Garlic 893 MG TABS, Take 500 mg by  mouth daily., Disp: , Rfl:  .  Magnesium 400 MG TABS, Take 400 mg by mouth 2 (two) times daily., Disp: , Rfl:  .  Methylcobalamin (METHYL B-12 PO), Take by mouth., Disp: , Rfl:  .  Multiple Vitamins-Minerals (MULTIVITAMIN ADULT PO), Take by mouth., Disp: , Rfl:  .  Taurine 1000 MG CAPS, Take 2,000 mg by mouth 3 (three) times daily., Disp: , Rfl:  .  TESTOSTERONE TD, Place onto the skin. 300mg /cc, Disp: , Rfl:  .  vitamin E 1000 UNIT capsule, Take 1,000 Units by mouth daily., Disp: , Rfl:  .  methimazole (TAPAZOLE) 5 MG tablet, Take 1 tablet (5 mg total) by mouth 3 (three) times daily., Disp: 90 tablet, Rfl: 2  Objective: BP 110/82 (BP Location: Left Arm, Patient Position: Sitting, Cuff Size: Large)   Pulse (!) 103   Temp 98.1 F (36.7 C) (Oral)   Resp 16   Ht 5\' 8"  (1.727 m)   Wt 251 lb 3.2 oz (113.9 kg)   SpO2 97%   BMI 38.19 kg/m  General: Awake, appears stated age HEENT: MMM, EOMi Neck: symmetric, no swelling or masses, no thyromegaly Heart: Her cardiac, regular rhythm, no murmurs, no bruits, no LE edema Lungs: CTAB, no rales, wheezes or rhonchi. No accessory muscle use Abd: BS+, soft, NT, ND, no masses or organomegaly Psych: Age appropriate judgment and  insight, normal affect and mood  Assessment and Plan: Hyperthyroidism - Plan: Ambulatory referral to Endocrinology, methimazole (TAPAZOLE) 5 MG tablet, Thyroid antibodies, CANCELED: Thyroid peroxidase antibody  Abdominal cramping - Plan: Tissue transglutaminase, IgA, Reticulin Antibody, IgA w reflex titer  Loose stools - Plan: Tissue transglutaminase, IgA, Reticulin Antibody, IgA w reflex titer  Orders as above. Will refer to endocrinology and start methimazole. Will order thyroid antibodies to narrow down etiology. Will also order celiac study given his history. He was counseled that this could be related to his thyroid. Also encouraged to keep a food diary. Follow-up pending the above. I will see him in 3 months for his  blood pressure. This is much improved today. The patient voiced understanding and agreement to the plan.  [place], DO 05/04/16  4:35 PM

## 2016-05-04 NOTE — Patient Instructions (Signed)
Keep a food diary as best you can. This could be related to your thyroid though.   This labs can take a little longer to get back than the normal labs.

## 2016-05-04 NOTE — Progress Notes (Signed)
Pre visit review using our clinic review tool, if applicable. No additional management support is needed unless otherwise documented below in the visit note. 

## 2016-05-05 LAB — THYROID ANTIBODIES
THYROID PEROXIDASE ANTIBODY: 1 [IU]/mL (ref <9)
Thyroglobulin Ab: <1 IU/mL (ref <2)

## 2016-05-05 LAB — TISSUE TRANSGLUTAMINASE, IGA: Tissue Transglutaminase Ab, IgA: 1 U/mL (ref <4)

## 2016-05-06 LAB — RETICULIN ANTIBODIES, IGA W TITER: Reticulin Ab, IgA: NEGATIVE

## 2016-05-10 ENCOUNTER — Telehealth (HOSPITAL_COMMUNITY): Payer: Self-pay

## 2016-05-12 ENCOUNTER — Ambulatory Visit (HOSPITAL_COMMUNITY): Payer: BLUE CROSS/BLUE SHIELD | Attending: Cardiovascular Disease

## 2016-05-12 ENCOUNTER — Other Ambulatory Visit: Payer: Self-pay

## 2016-05-12 ENCOUNTER — Ambulatory Visit (HOSPITAL_BASED_OUTPATIENT_CLINIC_OR_DEPARTMENT_OTHER): Payer: BLUE CROSS/BLUE SHIELD

## 2016-05-12 DIAGNOSIS — R002 Palpitations: Secondary | ICD-10-CM

## 2016-05-12 DIAGNOSIS — Z8249 Family history of ischemic heart disease and other diseases of the circulatory system: Secondary | ICD-10-CM | POA: Insufficient documentation

## 2016-05-12 DIAGNOSIS — R079 Chest pain, unspecified: Secondary | ICD-10-CM | POA: Insufficient documentation

## 2016-05-12 DIAGNOSIS — I10 Essential (primary) hypertension: Secondary | ICD-10-CM | POA: Insufficient documentation

## 2016-05-12 LAB — MYOCARDIAL PERFUSION IMAGING
CHL CUP RESTING HR STRESS: 112 {beats}/min
CSEPED: 6 min
Estimated workload: 7 METS
LHR: 0.29
LV sys vol: 36 mL
LVDIAVOL: 89 mL (ref 62–150)
MPHR: 177 {beats}/min
NUC STRESS TID: 0.94
Peak HR: 166 {beats}/min
Percent HR: 94 %
RPE: 17
SDS: 5
SRS: 5
SSS: 10

## 2016-05-12 MED ORDER — TECHNETIUM TC 99M TETROFOSMIN IV KIT
31.6000 | PACK | Freq: Once | INTRAVENOUS | Status: AC | PRN
  Administered 2016-05-12: 31.6 via INTRAVENOUS
  Filled 2016-05-12: qty 32

## 2016-05-12 MED ORDER — TECHNETIUM TC 99M TETROFOSMIN IV KIT
10.6000 | PACK | Freq: Once | INTRAVENOUS | Status: AC | PRN
  Administered 2016-05-12: 10.6 via INTRAVENOUS
  Filled 2016-05-12: qty 11

## 2016-05-12 NOTE — Progress Notes (Signed)
Cardiology Office Note    Date:  05/17/2016   ID:  Kevin Newman, DOB 1970/07/04, MRN [id number]  PCP:  Kevin Pal, DO  Cardiologist:  New to Dr. [name] (will be followed by General cardiologist)  Chief Complaint: Chest pain follow up  History of Present Illness:   Kevin Newman is a 46 y.o. male with hx of HTN and Anxiety presents for chest pain follow up.   Seen by me 04/27/16 for initial consultation for intermittent chest tightness x 1 months. 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 Also has intermittent palpitations lasting for 5-10 minutes, mostly occurs at night.  Discussed with Dr. [name] (DOD). His palpitation likely due to abnormal thyroid level. Chest pain was atypical. Trial of PPI.   Echo showed normal EF, no WM abnormality, PA pressure of 39 mm Hg. Myoview low risk. Normal response to exercise.   Patient was seen by PCP 05/04/16 --> started on Methimazole and referred to endocrinology for hyperthyroidism.   Here today for follow up. He continues to have palpitations. Mostly at night. The patient denies nausea, vomiting, fever, chest pain, shortness of breath, orthopnea, PND, dizziness, syncope, cough, congestion, abdominal pain, hematochezia, melena, lower extremity edema.  Past Medical History:  Diagnosis Date  . Anxiety   . Essential hypertension 04/22/2016    Past Surgical History:  Procedure Laterality Date  . NO PAST SURGERIES      Current Medications: Prior to Admission medications   Medication Sig Start Date End Date Taking? Authorizing Provider  amLODipine (NORVASC) 5 MG tablet Take 5 mg by mouth daily.    Historical Provider, MD  anastrozole (ARIMIDEX) 1 MG tablet Take 1 tablet by mouth 2 (two) times a week. 03/30/16   Historical Provider, MD  chlorthalidone (HYGROTON) 25 MG tablet Take 1 tablet (25 mg total) by mouth daily. 04/22/16   Kevin Pal, DO  Cholecalciferol (VITAMIN D-3 PO) Take by mouth.     Historical Provider, MD  Cobalamine Combinations (B12 FOLATE) 800-800 MCG CAPS Take 800 mcg by mouth daily.    Historical Provider, MD  Coenzyme Q10 (COQ10) 100 MG CAPS Take 100 mg by mouth daily.    Historical Provider, MD  Dexchlorphen-PSE-Methscop (D-HIST D PO) Take by mouth.    Historical Provider, MD  DHEA 25 MG CAPS Take by mouth.    Historical Provider, MD  Garlic 833 MG TABS Take 500 mg by mouth daily.    Historical Provider, MD  Magnesium 400 MG TABS Take 400 mg by mouth 2 (two) times daily.    Historical Provider, MD  methimazole (TAPAZOLE) 5 MG tablet Take 1 tablet (5 mg total) by mouth 3 (three) times daily. 05/04/16   Kevin Pal, DO  Methylcobalamin (METHYL B-12 PO) Take by mouth.    Historical Provider, MD  Multiple Vitamins-Minerals (MULTIVITAMIN ADULT PO) Take by mouth.    Historical Provider, MD  Taurine 1000 MG CAPS Take 2,000 mg by mouth 3 (three) times daily.    Historical Provider, MD  TESTOSTERONE TD Place onto the skin. 300mg /cc    Historical Provider, MD  vitamin E 1000 UNIT capsule Take 1,000 Units by mouth daily.    Historical Provider, MD    Allergies:   Amoxil [amoxicillin] and Sulfa antibiotics   Social History   Social History  . Marital status: Divorced    Spouse name: N/A  . Number of children: N/A  . Years of education: N/A   Social History Main Topics  .  Smoking status: Never Smoker  . Smokeless tobacco: Never Used  . Alcohol use No  . Drug use: No  . Sexual activity: No   Other Topics Concern  . None   Social History Narrative  . None     Family History:  The patient's family history includes Heart disease (age of onset: 106) in his maternal grandmother.   ROS:   Please see the history of present illness.    ROS All other systems reviewed and are negative.   PHYSICAL EXAM:   VS:  BP 124/78   Pulse 97   Ht 5\' 8"  (1.727 m)   Wt 253 lb 12.8 oz (115.1 kg)   SpO2 96%   BMI 38.59 kg/m    GEN: Well nourished, well developed,  in no acute distress  HEENT: normal  Neck: no JVD, carotid bruits, or masses Cardiac: RRR; no murmurs, rubs, or gallops,no edema  Respiratory:  clear to auscultation bilaterally, normal work of breathing GI: soft, nontender, nondistended, + BS MS: no deformity or atrophy  Skin: warm and dry, no rash Neuro:  Alert and Oriented x 3, Strength and sensation are intact Psych: euthymic mood, full affect  Wt Readings from Last 3 Encounters:  05/17/16 253 lb 12.8 oz (115.1 kg)  05/04/16 251 lb 3.2 oz (113.9 kg)  04/27/16 252 lb (114.3 kg)     Studies/Labs Reviewed:   EKG:  EKG is not  ordered today.   Recent Labs: 04/07/2016: ALT 11; BUN 27; Creatinine, Ser 0.99; Hemoglobin 16.1; Platelets 204; Potassium 3.7; Sodium 139 04/28/2016: TSH 0.01   Lipid Panel    Component Value Date/Time   CHOL 161 04/22/2016 0914   TRIG 149.0 04/22/2016 0914   HDL 47.50 04/22/2016 0914   CHOLHDL 3 04/22/2016 0914   VLDL 29.8 04/22/2016 0914   LDLCALC 83 04/22/2016 0914    Additional studies/ records that were reviewed today include:   Echocardiogram: 05/12/16 Study Conclusions  - Left ventricle: The cavity size was normal. There was mild   concentric hypertrophy. Systolic function was normal. The   estimated ejection fraction was in the range of 55% to 60%. Wall   motion was normal; there were no regional wall motion   abnormalities. Left ventricular diastolic function parameters   were normal. - Aortic valve: Transvalvular velocity was within the normal range.   There was no stenosis. There was no regurgitation. - Mitral valve: Transvalvular velocity was within the normal range.   There was no evidence for stenosis. There was no regurgitation. - Right ventricle: The cavity size was normal. Wall thickness was   normal. Systolic function was normal. - Atrial septum: No defect or patent foramen ovale was identified   by color flow Doppler. - Tricuspid valve: There was trivial regurgitation. -  Pulmonary arteries: Systolic pressure was at the upper limits of   normal. PA peak pressure: 39 mm Hg (S).  Exercise Myoview 05/12/16  The left ventricular ejection fraction is normal (55-65%).  Nuclear stress EF: 59%.  The study is normal.  This is a low risk study.  Blood pressure demonstrated a normal response to exercise.  There was no ST segment deviation noted during stress.    ASSESSMENT & PLAN:    1. Chest pain  - Resolved. Normal Myoview and echo.   2. Palpitations - Likely due to hyperactive thyroid. Stable. No dizziness, shortness of breath or syncope. Pending evaluation by Endocrinologist. He will let us know if worsening palpitations. Will wait until recommendation  by Endocrinologist. If required will get monitor.     Medication Adjustments/Labs and Tests Ordered: Current medicines are reviewed at length with the patient today.  Concerns regarding medicines are outlined above.  Medication changes, Labs and Tests ordered today are listed in the Patient Instructions below. Patient Instructions  Medication Instructions:    Your physician recommends that you continue on your current medications as directed. Please refer to the Current Medication list given to you today.   If you need a refill on your cardiac medications before your next appointment, please call your pharmacy.  Labwork: NONE ORDERED  TODAY    Testing/Procedures: NONE ORDERED  TODAY    Follow-Up: CONTACT [facility] [phone] AS NEEDED FOR  ANY CARDIAC RELATED SYMPTOMS    Any Other Special Instructions Will Be Listed Below (If Applicable).                                                                                                                                                      Jarrett Soho, [place]  05/17/2016 3:38 PM    [facility] [place], [place], [place]  [address] Phone: [phone]; Fax: [phone]

## 2016-05-17 ENCOUNTER — Ambulatory Visit (INDEPENDENT_AMBULATORY_CARE_PROVIDER_SITE_OTHER): Payer: BLUE CROSS/BLUE SHIELD | Admitting: Physician Assistant

## 2016-05-17 ENCOUNTER — Encounter: Payer: Self-pay | Admitting: Physician Assistant

## 2016-05-17 VITALS — BP 124/78 | HR 97 | Ht 68.0 in | Wt 253.8 lb

## 2016-05-17 DIAGNOSIS — R002 Palpitations: Secondary | ICD-10-CM | POA: Diagnosis not present

## 2016-05-17 DIAGNOSIS — R079 Chest pain, unspecified: Secondary | ICD-10-CM | POA: Diagnosis not present

## 2016-05-17 NOTE — Patient Instructions (Signed)

## 2016-05-24 ENCOUNTER — Encounter: Payer: Self-pay | Admitting: Family Medicine

## 2016-05-25 ENCOUNTER — Ambulatory Visit: Payer: BLUE CROSS/BLUE SHIELD | Admitting: Family Medicine

## 2016-05-25 ENCOUNTER — Encounter: Payer: Self-pay | Admitting: Endocrinology

## 2016-05-25 ENCOUNTER — Ambulatory Visit (INDEPENDENT_AMBULATORY_CARE_PROVIDER_SITE_OTHER): Payer: BLUE CROSS/BLUE SHIELD | Admitting: Endocrinology

## 2016-05-25 VITALS — BP 130/90 | HR 128 | Ht 68.0 in | Wt 250.0 lb

## 2016-05-25 DIAGNOSIS — E0590 Thyrotoxicosis, unspecified without thyrotoxic crisis or storm: Secondary | ICD-10-CM

## 2016-05-25 DIAGNOSIS — R002 Palpitations: Secondary | ICD-10-CM

## 2016-05-25 MED ORDER — METOPROLOL SUCCINATE ER 50 MG PO TB24: 50.0000 mg | ORAL_TABLET | Freq: Every day | ORAL | 0 refills | Status: DC

## 2016-05-25 NOTE — Progress Notes (Addendum)
Patient ID: Kevin Newman, male   DOB: 22-Oct-1970, 46 y.o.   MRN: [id number]                                                                                                                Reason for Appointment:  Hyperthyroidism, new consultation  Referring physician: Nani Ravens   Chief complaint: Palpitations   History of Present Illness:   For the last couple of months the patient has had symptoms of palpitations with tendency to fast heartbeat and racing heart Also initially was having some pressure discomfort in his left chest with some numbness in his left arm He does not think he has symptoms of shakiness, feeling excessively warm and sweaty or unusual fatigue. He thinks he may be feeling a little more nervous than usual. Also he does not think he has lost any weight or have any change in appetite  Wt Readings from Last 3 Encounters:  05/25/16 250 lb (113.4 kg)  05/17/16 253 lb 12.8 oz (115.1 kg)  05/04/16 251 lb 3.2 oz (113.9 kg)     Treatments so far: Methimazole 5 mg 3 times a day since 05/05/16  She says that he has not felt any different with starting methimazole and still has some degree of palpitations, however does not complain of any chest discomfort  Thyroid function tests as follows:     Lab Results  Component Value Date   FREET4 1.71 (H) 04/28/2016   TSH 0.01 (L) 04/28/2016   TSH 0.01 (L) 04/22/2016    No results found for: THYROTRECAB   Allergies as of 05/25/2016      Reactions   Amoxil [amoxicillin]    Sulfa Antibiotics       Medication List       Accurate as of 05/25/16  4:13 PM. Always use your most recent med list.          amLODipine 5 MG tablet Commonly known as:  NORVASC Take 5 mg by mouth daily.   anastrozole 1 MG tablet Commonly known as:  ARIMIDEX Take 1 tablet by mouth 2 (two) times a week.   B12 Folate 800-800 MCG Caps Take 800 mcg by mouth daily.   chlorthalidone 25 MG tablet Commonly known as:  HYGROTON Take 1 tablet (25  mg total) by mouth daily.   CoQ10 100 MG Caps Take 100 mg by mouth daily.   D-HIST D PO Take by mouth.   DHEA 25 MG Caps Take by mouth.   Garlic 086 MG Tabs Take 500 mg by mouth daily.   Magnesium 400 MG Tabs Take 400 mg by mouth 2 (two) times daily.   methimazole 5 MG tablet Commonly known as:  TAPAZOLE Take 1 tablet (5 mg total) by mouth 3 (three) times daily.   METHYL B-12 PO Take by mouth.   MULTIVITAMIN ADULT PO Take by mouth.   OVER THE COUNTER MEDICATION IODINE. 12.5mg .  Take 2 capsules daily with food   OVER THE COUNTER MEDICATION BIOTIC DETOXIFICATION SUPPORT.  Take 1 tablet  daily   Taurine 1000 MG Caps Take 2,000 mg by mouth 3 (three) times daily.   TESTOSTERONE TD Place onto the skin. 300mg /cc   VITAMIN D-3 PO Take by mouth.   vitamin E 1000 UNIT capsule Take 1,000 Units by mouth daily.           Past Medical History:  Diagnosis Date  . Anxiety   . Essential hypertension 04/22/2016    Past Surgical History:  Procedure Laterality Date  . NO PAST SURGERIES      Family History  Problem Relation Age of Onset  . Heart disease Maternal Grandmother 80    Social History:  reports that he has never smoked. He has never used smokeless tobacco. He reports that he does not drink alcohol or use drugs.  Allergies:  Allergies  Allergen Reactions  . Amoxil [Amoxicillin]   . Sulfa Antibiotics      Review of Systems  Constitutional: Negative for weight loss and reduced appetite.  HENT: Negative for trouble swallowing.   Eyes:       Does not complain of irritation or dryness of his eyes  Cardiovascular: Positive for palpitations. Negative for leg swelling.  Gastrointestinal: Positive for diarrhea.       He was having some diarrhea and more stools especially after meals but is somewhat better recently using Metamucil  Endocrine: Negative for general weakness.  Genitourinary: Negative for frequency.  Musculoskeletal: Negative for muscle aches.    Skin: Negative for rash.       He has had mild itching without a rash in the last couple of weeks  Neurological: Negative for weakness.  Psychiatric/Behavioral: Positive for nervousness.       Examination:   BP 130/90   Pulse (!) 128   Ht 5\' 8"  (1.727 m)   Wt 250 lb (113.4 kg)   SpO2 97%   BMI 38.01 kg/m    General Appearance:  well-built With mild generalized obesity He appears anxious but not hyperkinetic   Eyes: No unusual prominence but he does have a mild stare and also lid lag  . No swelling of the eyelids   Neck: The thyroid is barely palpable on the right side on swallowing and soft.  Left side is not palpable There is no lymphadenopathy .           Heart: normal S1 and S2, no murmurs .          Lungs: breath sounds are clear bilaterally Abdomen: no hepatosplenomegaly or other palpable abnormality  Extremities: hands are warm, not diaphoretic. No ankle edema.  Neurological: Deep tendon reflexes at biceps are very brisk. Bilateral fine tremors are present, relatively mild and mostly on the right.  Skin: No rash, abnormal thickening of the skin on legs or pigmentation seen     Assessment/Plan:   Hyperthyroidism, Likely to be from Graves' disease   He has moderate symptoms without a goiter and most of his symptoms are related to palpitations He appears to be also anxious  However even though he has taken 15 mg of methimazole for the last 3 weeks or so he appears to be still symptomatic with continued tachycardia Most likely needs further increase in his dosage This will be determined by his repeat labs today, also needs to have free T3 evaluated which has not been done previously  He will also need to have baseline thyrotropin receptor antibody, discussed importance of doing this not only to confirm his diagnosis but also to help with  prognosis during the course of his treatment with antithyroid drugs   Discussed with the patient the hyperthyroidism as being  an autoimmune thyroid disease.  Explained the options for treatment including antithyroid drugs and radioactive iodine.  Discussed the pros and cons for each treatment: Antithyroid drugs would be reasonable for mild disease but would need frequent followup with lab monitoring as well as potential for side effects from the medications and uncertainty about long-term cure of the problem Discussed that I-131 treatment is safe and simple to do but will result in long-term hypothyroidism that will result from ablation of the thyroid tissue and the need for lifelong supplementation and periodic monitoring.  Patient handout on hyperthyroidism and radioactive iodine treatment given Patient understands the above discussion and treatment options. All questions were answered satisfactorily  Consult note sent to referring physician  [facility] 05/25/2016, 4:13 PM    Note: This office note was prepared with Dragon voice recognition system technology. Any transcriptional errors that result from this process are unintentional.  ADDENDUM: Thyrotropin receptor antibody is negative, will consider a thyroid scan once Hyperthyroidism is controlled

## 2016-05-26 ENCOUNTER — Encounter: Payer: Self-pay | Admitting: Endocrinology

## 2016-05-26 LAB — T3, FREE: T3, Free: 8.6 pg/mL — ABNORMAL HIGH (ref 2.0–4.4)

## 2016-05-26 LAB — T4, FREE: FREE T4: 3.16 ng/dL — AB (ref 0.82–1.77)

## 2016-05-26 LAB — THYROTROPIN RECEPTOR AUTOABS: Thyrotropin Receptor Ab: 0.66 IU/L (ref 0.00–1.75)

## 2016-05-29 ENCOUNTER — Encounter: Payer: Self-pay | Admitting: Endocrinology

## 2016-06-02 ENCOUNTER — Telehealth: Payer: Self-pay | Admitting: Family Medicine

## 2016-06-02 ENCOUNTER — Encounter: Payer: Self-pay | Admitting: Family Medicine

## 2016-06-02 DIAGNOSIS — F4321 Adjustment disorder with depressed mood: Secondary | ICD-10-CM | POA: Diagnosis not present

## 2016-06-02 MED ORDER — AMLODIPINE BESYLATE 5 MG PO TABS: 5.0000 mg | ORAL_TABLET | Freq: Every day | ORAL | 1 refills | Status: DC

## 2016-06-02 NOTE — Telephone Encounter (Signed)
[facility] to [facility] Call back [phone]   Reason for call:  Dr. Dwyane Dee placed lab orders, advised patient to have labs drawn within our office, please advise if this is ok

## 2016-06-02 NOTE — Telephone Encounter (Signed)
Patient informed. 

## 2016-06-02 NOTE — Telephone Encounter (Signed)
Yes we can draw the labs as long as the labs have been placed.  Please let the pt know that he will need to let the lab person know that the lab have been ordered by Dr. Juluis Pitch

## 2016-06-14 DIAGNOSIS — F4321 Adjustment disorder with depressed mood: Secondary | ICD-10-CM | POA: Diagnosis not present

## 2016-06-17 ENCOUNTER — Other Ambulatory Visit (INDEPENDENT_AMBULATORY_CARE_PROVIDER_SITE_OTHER): Payer: BLUE CROSS/BLUE SHIELD

## 2016-06-17 DIAGNOSIS — E0590 Thyrotoxicosis, unspecified without thyrotoxic crisis or storm: Secondary | ICD-10-CM | POA: Diagnosis not present

## 2016-06-17 LAB — T4, FREE: FREE T4: 2.09 ng/dL — AB (ref 0.60–1.60)

## 2016-06-17 LAB — T3, FREE: T3, Free: 6.7 pg/mL — ABNORMAL HIGH (ref 2.3–4.2)

## 2016-06-22 ENCOUNTER — Ambulatory Visit (INDEPENDENT_AMBULATORY_CARE_PROVIDER_SITE_OTHER): Payer: BLUE CROSS/BLUE SHIELD | Admitting: Endocrinology

## 2016-06-22 ENCOUNTER — Encounter: Payer: Self-pay | Admitting: Endocrinology

## 2016-06-22 VITALS — BP 122/84 | HR 113 | Ht 68.0 in | Wt 255.0 lb

## 2016-06-22 DIAGNOSIS — E0590 Thyrotoxicosis, unspecified without thyrotoxic crisis or storm: Secondary | ICD-10-CM | POA: Diagnosis not present

## 2016-06-22 MED ORDER — METHIMAZOLE 10 MG PO TABS: 20.0000 mg | ORAL_TABLET | Freq: Two times a day (BID) | ORAL | 3 refills | Status: DC

## 2016-06-22 NOTE — Progress Notes (Signed)
Patient ID: Kevin Newman, male   DOB: November 22, 1970, 46 y.o.   MRN: [id number]                                                                                                                Reason for Appointment:  Hyperthyroidism, follow-up  Referring physician: Nani Ravens   Chief complaint: Palpitations   History of Present Illness:   He was seen in initial consultation in 04/2016 with symptoms of palpitations with tendency to fast heartbeat for a couple of months as well as symptoms of feeling a little more nervous and shaky at times Had no other typical symptoms  He was initially started on methimazole 15 mg a day by his PCP but he did not feel any significant improvement with this Since his free T4 was still significantly high at 3.2 he was told to increase his methimazole to 25 mg a day total using 2 tablets in the morning and 3 in the evening He was also asked to start metoprolol for his palpitations but he has not done so  Recently he says he is feeling a little less palpitations but his pulse is still fast Has gained a little weight  Wt Readings from Last 3 Encounters:  06/22/16 255 lb (115.7 kg)  05/25/16 250 lb (113.4 kg)  05/17/16 253 lb 12.8 oz (115.1 kg)   Thyroid function tests as follows:     Lab Results  Component Value Date   FREET4 2.09 (H) 06/17/2016   FREET4 3.16 (H) 05/25/2016   FREET4 1.71 (H) 04/28/2016   T3FREE 6.7 (H) 06/17/2016   T3FREE 8.6 (H) 05/25/2016   TSH 0.01 (L) 04/28/2016   TSH 0.01 (L) 04/22/2016    Lab Results  Component Value Date   THYROTRECAB 0.66 05/25/2016     Allergies as of 06/22/2016      Reactions   Amoxil [amoxicillin]    Sulfa Antibiotics       Medication List       Accurate as of 06/22/16  4:42 PM. Always use your most recent med list.          amLODipine 5 MG tablet Commonly known as:  NORVASC Take 1 tablet (5 mg total) by mouth daily.   anastrozole 1 MG tablet Commonly known as:  ARIMIDEX Take 1 tablet  by mouth 2 (two) times a week.   B12 Folate 800-800 MCG Caps Take 800 mcg by mouth daily.   chlorthalidone 25 MG tablet Commonly known as:  HYGROTON Take 1 tablet (25 mg total) by mouth daily.   CoQ10 100 MG Caps Take 100 mg by mouth daily.   D-HIST D PO Take by mouth.   DHEA 25 MG Caps Take by mouth.   Garlic 573 MG Tabs Take 500 mg by mouth daily.   Magnesium 400 MG Tabs Take 400 mg by mouth 2 (two) times daily.   methimazole 10 MG tablet Commonly known as:  TAPAZOLE Take 2 tablets (20 mg total) by mouth 2 (two) times daily.  METHYL B-12 PO Take by mouth.   metoprolol succinate 50 MG 24 hr tablet Commonly known as:  TOPROL-XL Take 1 tablet (50 mg total) by mouth daily. Take with or immediately following a meal.   MULTIVITAMIN ADULT PO Take by mouth.   OVER THE COUNTER MEDICATION IODINE. 12.5mg .  Take 2 capsules daily with food   OVER THE COUNTER MEDICATION BIOTIC DETOXIFICATION SUPPORT.  Take 1 tablet daily   Taurine 1000 MG Caps Take 2,000 mg by mouth 3 (three) times daily.   TESTOSTERONE TD Place onto the skin. 300mg /cc   VITAMIN D-3 PO Take by mouth.   vitamin E 1000 UNIT capsule Take 1,000 Units by mouth daily.           Past Medical History:  Diagnosis Date  . Anxiety   . Essential hypertension 04/22/2016    Past Surgical History:  Procedure Laterality Date  . NO PAST SURGERIES      Family History  Problem Relation Age of Onset  . Heart disease Maternal Grandmother 80  . Thyroid disease Mother     Social History:  reports that he has never smoked. He has never used smokeless tobacco. He reports that he does not drink alcohol or use drugs.  Allergies:  Allergies  Allergen Reactions  . Amoxil [Amoxicillin]   . Sulfa Antibiotics      Review of Systems     Examination:   BP 122/84   Pulse (!) 113   Ht 5\' 8"  (1.727 m)   Wt 255 lb (115.7 kg)   BMI 38.77 kg/m    He appears anxious but not hyperkinetic  Eyes: mild  stare The thyroid is not clearly palpable Deep tendon reflexes at biceps are  brisk. Bilateral fine tremors are present, relatively mild    Assessment/Plan:   Hyperthyroidism, etiology unclear He has significant Hyperthyroidism, no associated goiter but has a negative thyrotropin receptor antibody  Even with 25 mg of methimazole a day he is still having some palpitations, tachycardia and his thyroid levels are still high Unlikely to be having subacute thyroiditis since his symptoms have persisted for at least 3 months now  We do need to rule out a hot nodule as a cause of his hyperthyroidism and will have him go for a scan with uptake  Meanwhile he will start taking his metoprolol and increase his methimazole to 20 mg twice a day and follow-up in 1 month  [place] 06/22/2016, 4:42 PM    Note: This office note was prepared with Estate agent. Any transcriptional errors that result from this process are unintentional.

## 2016-06-23 ENCOUNTER — Encounter: Payer: Self-pay | Admitting: Endocrinology

## 2016-06-28 DIAGNOSIS — F4321 Adjustment disorder with depressed mood: Secondary | ICD-10-CM | POA: Diagnosis not present

## 2016-07-11 ENCOUNTER — Encounter (HOSPITAL_COMMUNITY): Payer: BLUE CROSS/BLUE SHIELD

## 2016-07-12 ENCOUNTER — Other Ambulatory Visit (HOSPITAL_COMMUNITY): Payer: BLUE CROSS/BLUE SHIELD

## 2016-07-15 ENCOUNTER — Encounter: Payer: Self-pay | Admitting: Endocrinology

## 2016-07-18 ENCOUNTER — Encounter (HOSPITAL_COMMUNITY)
Admission: RE | Admit: 2016-07-18 | Discharge: 2016-07-18 | Disposition: A | Discharge status: Home / Self Care | Payer: BLUE CROSS/BLUE SHIELD | Source: Ambulatory Visit | Attending: Endocrinology | Admitting: Endocrinology

## 2016-07-18 DIAGNOSIS — F4321 Adjustment disorder with depressed mood: Secondary | ICD-10-CM | POA: Diagnosis not present

## 2016-07-18 DIAGNOSIS — E0590 Thyrotoxicosis, unspecified without thyrotoxic crisis or storm: Secondary | ICD-10-CM | POA: Insufficient documentation

## 2016-07-18 MED ORDER — SODIUM IODIDE I 131 CAPSULE
8.6000 | Freq: Once | INTRAVENOUS | Status: AC | PRN
  Administered 2016-07-18: 8.6 via ORAL

## 2016-07-19 ENCOUNTER — Encounter (HOSPITAL_COMMUNITY)
Admission: RE | Admit: 2016-07-19 | Discharge: 2016-07-19 | Disposition: A | Discharge status: Home / Self Care | Payer: BLUE CROSS/BLUE SHIELD | Source: Ambulatory Visit | Attending: Endocrinology | Admitting: Endocrinology

## 2016-07-19 DIAGNOSIS — E0590 Thyrotoxicosis, unspecified without thyrotoxic crisis or storm: Secondary | ICD-10-CM | POA: Diagnosis not present

## 2016-07-19 MED ORDER — SODIUM PERTECHNETATE TC 99M INJECTION
10.0000 | Freq: Once | INTRAVENOUS | Status: AC | PRN
Start: 2016-07-19 — End: 2016-07-19
  Administered 2016-07-19: 9.9 via INTRAVENOUS

## 2016-07-21 ENCOUNTER — Other Ambulatory Visit: Payer: Self-pay

## 2016-07-21 ENCOUNTER — Telehealth: Payer: Self-pay | Admitting: Endocrinology

## 2016-07-21 MED ORDER — METOPROLOL SUCCINATE ER 50 MG PO TB24: 50.0000 mg | ORAL_TABLET | Freq: Every day | ORAL | 0 refills | Status: DC

## 2016-07-21 NOTE — Progress Notes (Signed)
Please call to let patient know that the scan shows he may have inflammation of the thyroid instead of overactive thyroid.  Also make sure he is not taking any herbal products, kelp or other sources of thyroid hormone, will discuss further at the office visit

## 2016-07-21 NOTE — Telephone Encounter (Signed)
Please call and advise patient about the email he sent 07-15-16. Patient is anxious about hearing back from Dr. Dwyane Dee. Patient would like to hear back today on mobile number.

## 2016-08-02 ENCOUNTER — Other Ambulatory Visit (INDEPENDENT_AMBULATORY_CARE_PROVIDER_SITE_OTHER): Payer: BLUE CROSS/BLUE SHIELD

## 2016-08-02 DIAGNOSIS — E0590 Thyrotoxicosis, unspecified without thyrotoxic crisis or storm: Secondary | ICD-10-CM

## 2016-08-02 LAB — T3, FREE: T3 FREE: 3.9 pg/mL (ref 2.3–4.2)

## 2016-08-02 LAB — T4, FREE: Free T4: 1.16 ng/dL (ref 0.60–1.60)

## 2016-08-04 ENCOUNTER — Ambulatory Visit: Payer: BLUE CROSS/BLUE SHIELD | Admitting: Family Medicine

## 2016-08-04 DIAGNOSIS — F4321 Adjustment disorder with depressed mood: Secondary | ICD-10-CM | POA: Diagnosis not present

## 2016-08-05 ENCOUNTER — Encounter: Payer: Self-pay | Admitting: Endocrinology

## 2016-08-05 ENCOUNTER — Ambulatory Visit (INDEPENDENT_AMBULATORY_CARE_PROVIDER_SITE_OTHER): Payer: BLUE CROSS/BLUE SHIELD | Admitting: Endocrinology

## 2016-08-05 VITALS — BP 124/74 | HR 83 | Ht 68.0 in | Wt 260.0 lb

## 2016-08-05 DIAGNOSIS — E0590 Thyrotoxicosis, unspecified without thyrotoxic crisis or storm: Secondary | ICD-10-CM | POA: Diagnosis not present

## 2016-08-05 NOTE — Patient Instructions (Signed)
Take 3 1/2 pills daily

## 2016-08-05 NOTE — Progress Notes (Signed)
Patient ID: Kevin Newman, male   DOB: 06-08-1970, 46 y.o.   MRN: [id number]                                                                                                                Reason for Appointment:  Hyperthyroidism, follow-up  Referring physician: Nani Ravens   Chief complaint: Palpitations   History of Present Illness:   He was seen in initial consultation in 04/2016 with symptoms of palpitations with tendency to fast heartbeat for a couple of months as well as symptoms of feeling a little more nervous and shaky at times Had no other typical symptoms  He was initially started on methimazole 15 mg a day by his PCP but he did not feel any significant improvement with this Since his free T4 was still significantly high at 3.2 he was told to increase his methimazole to 25 mg a day Subsequently on his follow-up in April he was still hyperthyroid and his methimazole was increased up to 20 mg twice a day Also taking metoprolol now  Recently he says he is having less palpitations, he does monitor his pulse and this has been just under 85 recently, previously in the 90s or more He does not feel as nervous and shaky No heat intolerance Weight has gone up some again  After discussion of his results which included normal thyrotropin receptor antibody, and I-131 uptake of only 0.9% and what appears to be diffuse uptake patient indicated that he has been taking iodine supplements for several months from his previous holistic physician and this was stopped after his I-131 results were available   Wt Readings from Last 3 Encounters:  08/05/16 260 lb (117.9 kg)  06/22/16 255 lb (115.7 kg)  05/25/16 250 lb (113.4 kg)   Thyroid function tests as follows:     Lab Results  Component Value Date   FREET4 1.16 08/02/2016   FREET4 2.09 (H) 06/17/2016   FREET4 3.16 (H) 05/25/2016   T3FREE 3.9 08/02/2016   T3FREE 6.7 (H) 06/17/2016   T3FREE 8.6 (H) 05/25/2016   TSH 0.01 (L) 04/28/2016     TSH 0.01 (L) 04/22/2016    Lab Results  Component Value Date   THYROTRECAB 0.66 05/25/2016     Allergies as of 08/05/2016      Reactions   Amoxil [amoxicillin]    Sulfa Antibiotics       Medication List       Accurate as of 08/05/16  9:00 AM. Always use your most recent med list.          amLODipine 5 MG tablet Commonly known as:  NORVASC Take 1 tablet (5 mg total) by mouth daily.   anastrozole 1 MG tablet Commonly known as:  ARIMIDEX Take 1 tablet by mouth 2 (two) times a week.   B12 Folate 800-800 MCG Caps Take 800 mcg by mouth daily.   chlorthalidone 25 MG tablet Commonly known as:  HYGROTON Take 1 tablet (25 mg total) by mouth daily.   CoQ10 100  MG Caps Take 100 mg by mouth daily.   D-HIST D PO Take by mouth.   DHEA 25 MG Caps Take by mouth.   Garlic 277 MG Tabs Take 500 mg by mouth daily.   Magnesium 400 MG Tabs Take 400 mg by mouth 2 (two) times daily.   methimazole 10 MG tablet Commonly known as:  TAPAZOLE Take 2 tablets (20 mg total) by mouth 2 (two) times daily.   METHYL B-12 PO Take by mouth.   metoprolol succinate 50 MG 24 hr tablet Commonly known as:  TOPROL-XL Take 1 tablet (50 mg total) by mouth daily. Take with or immediately following a meal.   MULTIVITAMIN ADULT PO Take by mouth.   OVER THE COUNTER MEDICATION IODINE. 12.5mg .  Take 2 capsules daily with food   OVER THE COUNTER MEDICATION BIOTIC DETOXIFICATION SUPPORT.  Take 1 tablet daily   Taurine 1000 MG Caps Take 2,000 mg by mouth 3 (three) times daily.   TESTOSTERONE TD Place onto the skin. 300mg /cc   VITAMIN D-3 PO Take by mouth.   vitamin E 1000 UNIT capsule Take 1,000 Units by mouth daily.           Past Medical History:  Diagnosis Date  . Anxiety   . Essential hypertension 04/22/2016    Past Surgical History:  Procedure Laterality Date  . NO PAST SURGERIES      Family History  Problem Relation Age of Onset  . Heart disease Maternal Grandmother  80  . Thyroid disease Mother     Social History:  reports that he has never smoked. He has never used smokeless tobacco. He reports that he does not drink alcohol or use drugs.  Allergies:  Allergies  Allergen Reactions  . Amoxil [Amoxicillin]   . Sulfa Antibiotics      Review of Systems     Examination:   BP 124/74   Pulse 83   Ht 5\' 8"  (1.727 m)   Wt 260 lb (117.9 kg)   SpO2 96%   BMI 39.53 kg/m    He appears calm  Eyes: mild stare The thyroid is not palpable Deep tendon reflexes at biceps are about normal Only minimal tremor Skin is not warm    Assessment/Plan:   Hyperthyroidism, most likely from iodine intake and [name] phenomenon  He subjectively doing better No goiter on exam With 40 mg methimazole his levels are now finally back to normal He has stopped his iodine intake and this will probably help also For now will reduce his dosage by 5 mg and have him follow-up in another month Discussed that if he starts feeling sluggish or lethargic.  May be from the thyroid level dropping further and he will let us know  [facility] 08/05/2016, 9:00 AM    Note: This office note was prepared with Estate agent. Any transcriptional errors that result from this process are unintentional.

## 2016-08-13 ENCOUNTER — Other Ambulatory Visit: Payer: Self-pay | Admitting: Endocrinology

## 2016-08-18 ENCOUNTER — Ambulatory Visit (INDEPENDENT_AMBULATORY_CARE_PROVIDER_SITE_OTHER): Payer: BLUE CROSS/BLUE SHIELD | Admitting: Family Medicine

## 2016-08-18 ENCOUNTER — Encounter: Payer: Self-pay | Admitting: Family Medicine

## 2016-08-18 VITALS — BP 110/80 | HR 94 | Temp 98.5°F | Ht 68.0 in | Wt 259.0 lb

## 2016-08-18 DIAGNOSIS — E291 Testicular hypofunction: Secondary | ICD-10-CM

## 2016-08-18 DIAGNOSIS — I10 Essential (primary) hypertension: Secondary | ICD-10-CM

## 2016-08-18 HISTORY — DX: Testicular hypofunction: E29.1

## 2016-08-18 NOTE — Progress Notes (Signed)
Chief Complaint  Patient presents with  . Follow-up    3 mos on HTN    Subjective Kevin Newman is a 46 y.o. male who presents for hypertension follow up. He does not routinely monitor home blood pressures. Blood pressures ranging from 110's/70's on average. He is compliant with medications. Patient has these side effects of medication: none He is sometimes adhering to a healthy diet overall. Current exercise: walking around 3 times per week   Past Medical History:  Diagnosis Date  . Anxiety   . Essential hypertension 04/22/2016  . Hypogonadism male 08/18/2016   Family History  Problem Relation Age of Onset  . Heart disease Maternal Grandmother 80  . Thyroid disease Mother    Medications Current Outpatient Prescriptions on File Prior to Visit  Medication Sig Dispense Refill  . amLODipine (NORVASC) 5 MG tablet Take 1 tablet (5 mg total) by mouth daily. 90 tablet 1  . anastrozole (ARIMIDEX) 1 MG tablet Take 1 tablet by mouth 2 (two) times a week.    . chlorthalidone (HYGROTON) 25 MG tablet Take 1 tablet (25 mg total) by mouth daily. 90 tablet 1  . Cholecalciferol (VITAMIN D-3 PO) Take by mouth.    . Cobalamine Combinations (B12 FOLATE) 800-800 MCG CAPS Take 800 mcg by mouth daily.    . Coenzyme Q10 (COQ10) 100 MG CAPS Take 100 mg by mouth daily.    Marland Kitchen Dexchlorphen-PSE-Methscop (D-HIST D PO) Take by mouth.    Marland Kitchen DHEA 25 MG CAPS Take by mouth.    . Garlic 767 MG TABS Take 500 mg by mouth daily.    . Magnesium 400 MG TABS Take 400 mg by mouth 2 (two) times daily.    . methimazole (TAPAZOLE) 10 MG tablet Take 2 tablets (20 mg total) by mouth 2 (two) times daily. 120 tablet 3  . metoprolol succinate (TOPROL-XL) 50 MG 24 hr tablet TAKE 1 TABLET BY MOUTH DAILY WITH OR IMMEDIATELY FOLLOWING A MEAL 30 tablet 0  . Multiple Vitamins-Minerals (MULTIVITAMIN ADULT PO) Take by mouth.    Marland Kitchen OVER THE COUNTER MEDICATION BIOTIC DETOXIFICATION SUPPORT.  Take 1 tablet daily    . Taurine 1000 MG CAPS  Take 2,000 mg by mouth 3 (three) times daily.    . TESTOSTERONE TD Place onto the skin. 300mg /cc    . vitamin E 1000 UNIT capsule Take 1,000 Units by mouth daily.    . Methylcobalamin (METHYL B-12 PO) Take by mouth.     Allergies Allergies  Allergen Reactions  . Amoxil [Amoxicillin]   . Sulfa Antibiotics     Review of Systems Cardiovascular: no chest pain Respiratory:  no shortness of breath  Exam BP 110/80 (BP Location: Left Arm, Patient Position: Sitting, Cuff Size: Normal)   Pulse 94   Temp 98.5 F (36.9 C) (Oral)   Ht 5\' 8"  (1.727 m)   Wt 259 lb (117.5 kg)   SpO2 96%   BMI 39.38 kg/m  General:  well developed, well nourished, in no apparent distress Skin:  warm, no pallor or diaphoresis Eyes:  pupils equal and round, sclera anicteric without injection Heart :RRR, no murmurs, no bruits Lungs:  clear to auscultation, no accessory muscle use Psych: well oriented with normal range of affect and appropriate judgment/insight  Essential hypertension - Plan: Basic metabolic panel  Hypogonadism male - Plan: Testosterone  Orders as above. Cont current regimen.  Counseled on diet and exercise. F/u in 6 mo or prn. The patient voiced understanding and agreement to the  plan.  [name], DO 08/18/16  4:43 PM

## 2016-08-18 NOTE — Patient Instructions (Addendum)
Make sure they get your testosterone and metabolic panel (BMP) at your 7/26 lab appointment.   Let us know if you need anything.

## 2016-09-01 DIAGNOSIS — F4321 Adjustment disorder with depressed mood: Secondary | ICD-10-CM | POA: Diagnosis not present

## 2016-09-14 ENCOUNTER — Ambulatory Visit: Payer: BLUE CROSS/BLUE SHIELD | Admitting: Endocrinology

## 2016-09-16 ENCOUNTER — Other Ambulatory Visit: Payer: Self-pay | Admitting: Endocrinology

## 2016-09-22 ENCOUNTER — Encounter: Payer: Self-pay | Admitting: Family Medicine

## 2016-09-22 ENCOUNTER — Other Ambulatory Visit (INDEPENDENT_AMBULATORY_CARE_PROVIDER_SITE_OTHER): Payer: BLUE CROSS/BLUE SHIELD

## 2016-09-22 DIAGNOSIS — E291 Testicular hypofunction: Secondary | ICD-10-CM

## 2016-09-22 DIAGNOSIS — I10 Essential (primary) hypertension: Secondary | ICD-10-CM | POA: Diagnosis not present

## 2016-09-22 DIAGNOSIS — E0590 Thyrotoxicosis, unspecified without thyrotoxic crisis or storm: Secondary | ICD-10-CM

## 2016-09-22 LAB — TSH: TSH: 10.26 u[IU]/mL — ABNORMAL HIGH (ref 0.35–4.50)

## 2016-09-22 LAB — BASIC METABOLIC PANEL
BUN: 13 mg/dL (ref 6–23)
CALCIUM: 9.6 mg/dL (ref 8.4–10.5)
CO2: 29 mEq/L (ref 19–32)
Chloride: 100 mEq/L (ref 96–112)
Creatinine, Ser: 1.23 mg/dL (ref 0.40–1.50)
GFR: 67.87 mL/min (ref >60.00)
Glucose, Bld: 99 mg/dL (ref 70–99)
POTASSIUM: 3.6 meq/L (ref 3.5–5.1)
SODIUM: 138 meq/L (ref 135–145)

## 2016-09-22 LAB — T4, FREE: FREE T4: 0.69 ng/dL (ref 0.60–1.60)

## 2016-09-22 LAB — TESTOSTERONE: Testosterone: 520.19 ng/dL (ref 300.00–890.00)

## 2016-09-27 ENCOUNTER — Ambulatory Visit (INDEPENDENT_AMBULATORY_CARE_PROVIDER_SITE_OTHER): Payer: BLUE CROSS/BLUE SHIELD | Admitting: Endocrinology

## 2016-09-27 ENCOUNTER — Encounter: Payer: Self-pay | Admitting: Endocrinology

## 2016-09-27 VITALS — BP 120/74 | HR 89 | Ht 68.0 in | Wt 260.2 lb

## 2016-09-27 DIAGNOSIS — E0590 Thyrotoxicosis, unspecified without thyrotoxic crisis or storm: Secondary | ICD-10-CM

## 2016-09-27 MED ORDER — METOPROLOL SUCCINATE ER 25 MG PO TB24: 25.0000 mg | ORAL_TABLET | Freq: Every day | ORAL | 3 refills | Status: DC

## 2016-09-27 NOTE — Progress Notes (Signed)
Patient ID: Kevin Newman, male   DOB: 11/15/1970, 46 y.o.   MRN: [id number]                                                                                                                 Referring physician: Nani Ravens   Chief complaint: Follow-up visit for thyroid   History of Present Illness:   He was seen in initial consultation in 04/2016 with symptoms of palpitations with tendency to fast heartbeat for a couple of months as well as symptoms of feeling a little more nervous and shaky at times Had no other typical symptoms  He was initially started on methimazole 15 mg a day by his PCP but he did not feel any significant improvement with this Since his free T4 was still significantly high at 3.2 he was told to increase his methimazole to 25 mg a day Subsequently on his follow-up in April he was still hyperthyroid and his methimazole was increased up to 20 mg twice a day Also taking metoprolol   Recent history: After discussion of his results which included normal thyrotropin receptor antibody, and I-131 uptake of only 0.9% and what appears to be diffuse uptake patient indicated that he has been taking iodine supplements for several months from his previous holistic physician and this was stopped after his I-131 results were available  His methimazole was reduced down to 30 mg in June when his free T4 level had come back down to normal with previous dose of 40 mg He continues to feel fairly good with no palpitations, may have mild cold intolerance now His weight has leveled off However he continues to still take metoprolol but his pulse is still mildly increased   Wt Readings from Last 3 Encounters:  09/27/16 260 lb 3.2 oz (118 kg)  08/18/16 259 lb (117.5 kg)  08/05/16 260 lb (117.9 kg)   Thyroid function tests as follows:     Lab Results  Component Value Date   FREET4 0.69 09/22/2016   FREET4 1.16 08/02/2016   FREET4 2.09 (H) 06/17/2016   T3FREE 3.9 08/02/2016   T3FREE  6.7 (H) 06/17/2016   T3FREE 8.6 (H) 05/25/2016   TSH 10.26 (H) 09/22/2016   TSH 0.01 (L) 04/28/2016   TSH 0.01 (L) 04/22/2016    Lab Results  Component Value Date   THYROTRECAB 0.66 05/25/2016     Allergies as of 09/27/2016      Reactions   Amoxil [amoxicillin]    Sulfa Antibiotics       Medication List       Accurate as of 09/27/16 10:28 AM. Always use your most recent med list.          amLODipine 5 MG tablet Commonly known as:  NORVASC Take 1 tablet (5 mg total) by mouth daily.   anastrozole 1 MG tablet Commonly known as:  ARIMIDEX Take 1 tablet by mouth 2 (two) times a week.   B12 Folate 800-800 MCG Caps Take 800 mcg by mouth daily.   chlorthalidone  25 MG tablet Commonly known as:  HYGROTON Take 1 tablet (25 mg total) by mouth daily.   CoQ10 100 MG Caps Take 100 mg by mouth daily.   D-HIST D PO Take by mouth.   DHEA 25 MG Caps Take by mouth.   Garlic 197 MG Tabs Take 500 mg by mouth daily.   Magnesium 400 MG Tabs Take 400 mg by mouth 2 (two) times daily.   methimazole 10 MG tablet Commonly known as:  TAPAZOLE Take 2 tablets (20 mg total) by mouth 2 (two) times daily.   METHYL B-12 PO Take by mouth.   metoprolol succinate 50 MG 24 hr tablet Commonly known as:  TOPROL-XL TAKE 1 TABLET BY MOUTH DAILY WITH OR IMMEDIATELY FOLLOWING A MEAL   MULTIVITAMIN ADULT PO Take by mouth.   OVER THE COUNTER MEDICATION BIOTIC DETOXIFICATION SUPPORT.  Take 1 tablet daily   Taurine 1000 MG Caps Take 2,000 mg by mouth 3 (three) times daily.   TESTOSTERONE TD Place onto the skin. 300mg /cc   VITAMIN D-3 PO Take by mouth.   vitamin E 1000 UNIT capsule Take 1,000 Units by mouth daily.           Past Medical History:  Diagnosis Date  . Anxiety   . Essential hypertension 04/22/2016  . Hypogonadism male 08/18/2016    Past Surgical History:  Procedure Laterality Date  . NO PAST SURGERIES      Family History  Problem Relation Age of Onset  .  Heart disease Maternal Grandmother 80  . Thyroid disease Mother     Social History:  reports that he has never smoked. He has never used smokeless tobacco. He reports that he does not drink alcohol or use drugs.  Allergies:  Allergies  Allergen Reactions  . Amoxil [Amoxicillin]   . Sulfa Antibiotics      Review of Systems     Examination:   BP 120/74   Pulse 89   Ht 5\' 8"  (1.727 m)   Wt 260 lb 3.2 oz (118 kg)   SpO2 97%   BMI 39.56 kg/m    The thyroid is not palpable Deep tendon reflexes at biceps are normal Skin is normal appearing    Assessment/Plan:   Hyperthyroidism, most likely from iodine intake and [name] phenomenon  He appears to be requiring progressively lower dose of methimazole now especially after his stopping the iodine supplements Free T4 is low normal but his TSH has gone up significantly to about 10  Since his levels have dropped significantly he probably is not hyperthyroid any more and will have him stop his methimazole until next visit He can continue metoprolol but reduce the dose to 25 mg for now This will continue to help his blood pressure He will call if he has any new symptoms  Giuseppina Quinones 09/27/2016, 10:28 AM    Note: This office note was prepared with Dragon voice recognition system technology. Any transcriptional errors that result from this process are unintentional.

## 2016-09-27 NOTE — Patient Instructions (Signed)
Stop Rx 

## 2016-09-30 ENCOUNTER — Other Ambulatory Visit: Payer: Self-pay | Admitting: Family Medicine

## 2016-09-30 DIAGNOSIS — I10 Essential (primary) hypertension: Secondary | ICD-10-CM

## 2016-10-03 ENCOUNTER — Other Ambulatory Visit: Payer: Self-pay | Admitting: *Deleted

## 2016-10-03 DIAGNOSIS — I10 Essential (primary) hypertension: Secondary | ICD-10-CM

## 2016-10-03 NOTE — Telephone Encounter (Signed)
Refill for Chlorthalidone 25mg  1qd till visit scheduled in Dec/thx dmf,rma/AB/CMA

## 2016-10-04 DIAGNOSIS — F4321 Adjustment disorder with depressed mood: Secondary | ICD-10-CM | POA: Diagnosis not present

## 2016-10-05 ENCOUNTER — Encounter: Payer: Self-pay | Admitting: Family Medicine

## 2016-10-05 DIAGNOSIS — I10 Essential (primary) hypertension: Secondary | ICD-10-CM

## 2016-10-05 MED ORDER — CHLORTHALIDONE 25 MG PO TABS: 25.0000 mg | ORAL_TABLET | Freq: Every day | ORAL | 1 refills | Status: DC

## 2016-11-03 ENCOUNTER — Encounter: Payer: Self-pay | Admitting: Family Medicine

## 2016-11-03 ENCOUNTER — Other Ambulatory Visit (INDEPENDENT_AMBULATORY_CARE_PROVIDER_SITE_OTHER): Payer: BLUE CROSS/BLUE SHIELD

## 2016-11-03 DIAGNOSIS — E0590 Thyrotoxicosis, unspecified without thyrotoxic crisis or storm: Secondary | ICD-10-CM

## 2016-11-03 LAB — T4, FREE: FREE T4: 0.91 ng/dL (ref 0.60–1.60)

## 2016-11-03 LAB — TSH: TSH: 1.87 u[IU]/mL (ref 0.35–4.50)

## 2016-11-03 NOTE — Telephone Encounter (Signed)
That's an off label use. I would actually run it by the endocrinologist. We can refer him to someone if he is concerned regarding this. I am not comfortable though. TY.

## 2016-11-09 ENCOUNTER — Ambulatory Visit (INDEPENDENT_AMBULATORY_CARE_PROVIDER_SITE_OTHER): Payer: BLUE CROSS/BLUE SHIELD | Admitting: Endocrinology

## 2016-11-09 ENCOUNTER — Encounter: Payer: Self-pay | Admitting: Endocrinology

## 2016-11-09 VITALS — BP 142/90 | HR 88 | Ht 68.0 in | Wt 256.0 lb

## 2016-11-09 DIAGNOSIS — E291 Testicular hypofunction: Secondary | ICD-10-CM

## 2016-11-09 DIAGNOSIS — Z8639 Personal history of other endocrine, nutritional and metabolic disease: Secondary | ICD-10-CM

## 2016-11-09 NOTE — Progress Notes (Signed)
Patient ID: Kevin Newman, male   DOB: 09/15/1970, 46 y.o.   MRN: [id number]                                                                                                                 Referring physician: Nani Newman   Chief complaint: Follow-up visit for thyroid   History of Present Illness:   He was seen in initial consultation in 04/2016 with symptoms of palpitations with tendency to fast heartbeat for a couple of months as well as symptoms of feeling a little more nervous and shaky at times Had no other typical symptoms  He was initially started on methimazole 15 mg a day by his PCP but he did not feel any significant improvement with this Since his free T4 was still significantly high at 3.2 he was told to increase his methimazole to 25 mg a day Subsequently on his follow-up in April he was still hyperthyroid and his methimazole was increased up to 20 mg twice a day Also taking metoprolol   Recent history: After discussion of his results which included normal thyrotropin receptor antibody, and I-131 uptake of only 0.9% and what appears to be diffuse uptake patient indicated that he has been taking iodine supplements for several months from his previous holistic physician and this was stopped after his I-131 results were available  His methimazole was reduced down to 30 mg in June and in July it was stopped when his TSH was 10.3  He continues to feel fairly good with no palpitations, may have mild cold intolerance now His weight has leveled off  However he continues to still take metoprolol but his pulse is still in 80s   Wt Readings from Last 3 Encounters:  11/09/16 256 lb (116.1 kg)  09/27/16 260 lb 3.2 oz (118 kg)  08/18/16 259 lb (117.5 kg)   Thyroid function tests as follows:     Lab Results  Component Value Date   FREET4 0.91 11/03/2016   FREET4 0.69 09/22/2016   FREET4 1.16 08/02/2016   T3FREE 3.9 08/02/2016   T3FREE 6.7 (H) 06/17/2016   T3FREE 8.6 (H)  05/25/2016   TSH 1.87 11/03/2016   TSH 10.26 (H) 09/22/2016   TSH 0.01 (L) 04/28/2016    Lab Results  Component Value Date   THYROTRECAB 0.66 05/25/2016     Allergies as of 11/09/2016      Reactions   Amoxil [amoxicillin]    Sulfa Antibiotics       Medication List       Accurate as of 11/09/16  4:14 PM. Always use your most recent med list.          amLODipine 5 MG tablet Commonly known as:  NORVASC Take 1 tablet (5 mg total) by mouth daily.   anastrozole 1 MG tablet Commonly known as:  ARIMIDEX Take 1 tablet by mouth 2 (two) times a week.   B12 Folate 800-800 MCG Caps Take 800 mcg by mouth daily.   chlorthalidone 25 MG tablet Commonly known  as:  HYGROTON Take 1 tablet (25 mg total) by mouth daily.   CoQ10 100 MG Caps Take 100 mg by mouth daily.   D-HIST D PO Take by mouth.   DHEA 25 MG Caps Take by mouth.   Garlic 263 MG Tabs Take 500 mg by mouth daily.   Magnesium 400 MG Tabs Take 400 mg by mouth 2 (two) times daily.   methimazole 10 MG tablet Commonly known as:  TAPAZOLE Take 2 tablets (20 mg total) by mouth 2 (two) times daily.   METHYL B-12 PO Take by mouth.   metoprolol succinate 25 MG 24 hr tablet Commonly known as:  TOPROL-XL Take 1 tablet (25 mg total) by mouth daily.   MULTIVITAMIN ADULT PO Take by mouth.   OVER THE COUNTER MEDICATION BIOTIC DETOXIFICATION SUPPORT.  Take 1 tablet daily   Taurine 1000 MG Caps Take 2,000 mg by mouth 3 (three) times daily.   TESTOSTERONE TD Place onto the skin. 300mg /cc   VITAMIN D-3 PO Take by mouth.   vitamin E 1000 UNIT capsule Take 1,000 Units by mouth daily.           Past Medical History:  Diagnosis Date  . Anxiety   . Essential hypertension 04/22/2016  . Hypogonadism male 08/18/2016    Past Surgical History:  Procedure Laterality Date  . NO PAST SURGERIES      Family History  Problem Relation Age of Onset  . Heart disease Maternal Grandmother 80  . Thyroid disease Mother      Social History:  reports that he has never smoked. He has never used smokeless tobacco. He reports that he does not drink alcohol or use drugs.  Allergies:  Allergies  Allergen Reactions  . Amoxil [Amoxicillin]   . Sulfa Antibiotics      Review of Systems  He is being followed by PCP for hypertension  Today he is also asking about his testosterone treatment at the started by his holistic physician previously Apparently testosterone level was normal with PCP a couple of weeks ago Not clear if he had evaluation of the etiology of hypogonadism He was also told to take Arimidex with his testosterone cream but his PCP does not want to prescribe this    Examination:   BP (!) 142/90   Pulse 88   Ht 5\' 8"  (1.727 m)   Wt 256 lb (116.1 kg)   SpO2 97%   BMI 38.92 kg/m    Deep tendon reflexes at biceps are normal Skin is normal appearing    Assessment/Plan:   Hyperthyroidism, most likely from iodine intake and [name] phenomenon  This has now resolved and thyroid levels are back to normal without any antithyroid drugs Explained to him that he does not have a thyroid problem per se He will avoid any iodine supplements in the future  He will call if he has any new symptoms He can continue metoprolol for now since he has a relatively fast heart rate and discussed with PCP  Recommended that he does not need to take Arimidex with his testosterone cream as this is not necessary and a minimum amount of estradiol in the system is normal for male patients  Kevin Newman 11/09/2016, 4:14 PM    Note: This office note was prepared with Estate agent. Any transcriptional errors that result from this process are unintentional.

## 2016-11-21 ENCOUNTER — Encounter: Payer: Self-pay | Admitting: Family Medicine

## 2016-11-21 ENCOUNTER — Other Ambulatory Visit: Payer: Self-pay | Admitting: Family Medicine

## 2016-11-21 DIAGNOSIS — R7989 Other specified abnormal findings of blood chemistry: Secondary | ICD-10-CM

## 2016-11-21 MED ORDER — TESTOSTERONE 30 MG/ACT TD SOLN: TRANSDERMAL | 0 refills | Status: DC

## 2016-11-21 NOTE — Telephone Encounter (Signed)
OK to reorder. TY.

## 2016-11-21 NOTE — Progress Notes (Signed)
Monitoring labs for testosterone therapy called in.

## 2016-11-23 ENCOUNTER — Other Ambulatory Visit: Payer: Self-pay | Admitting: Family Medicine

## 2016-12-22 ENCOUNTER — Other Ambulatory Visit (INDEPENDENT_AMBULATORY_CARE_PROVIDER_SITE_OTHER): Payer: BLUE CROSS/BLUE SHIELD

## 2016-12-22 DIAGNOSIS — R7989 Other specified abnormal findings of blood chemistry: Secondary | ICD-10-CM

## 2016-12-22 LAB — CBC WITH DIFFERENTIAL/PLATELET
Basophils Absolute: 0 10*3/uL (ref 0.0–0.1)
Basophils Relative: 0.5 % (ref 0.0–3.0)
EOS PCT: 0.7 % (ref 0.0–5.0)
Eosinophils Absolute: 0 10*3/uL (ref 0.0–0.7)
HEMATOCRIT: 52.8 % — AB (ref 39.0–52.0)
HEMOGLOBIN: 17.6 g/dL — AB (ref 13.0–17.0)
LYMPHS PCT: 23.4 % (ref 12.0–46.0)
Lymphs Abs: 1.4 10*3/uL (ref 0.7–4.0)
MCHC: 33.3 g/dL (ref 30.0–36.0)
MCV: 97.6 fl (ref 78.0–100.0)
Monocytes Absolute: 0.4 10*3/uL (ref 0.1–1.0)
Monocytes Relative: 7.4 % (ref 3.0–12.0)
Neutro Abs: 4.1 10*3/uL (ref 1.4–7.7)
Neutrophils Relative %: 68 % (ref 43.0–77.0)
Platelets: 252 10*3/uL (ref 150.0–400.0)
RBC: 5.41 Mil/uL (ref 4.22–5.81)
RDW: 12.8 % (ref 11.5–15.5)
WBC: 6 10*3/uL (ref 4.0–10.5)

## 2016-12-22 LAB — PSA: PSA: 0.8 ng/mL (ref 0.10–4.00)

## 2016-12-23 ENCOUNTER — Other Ambulatory Visit: Payer: Self-pay | Admitting: Family Medicine

## 2016-12-23 DIAGNOSIS — D582 Other hemoglobinopathies: Secondary | ICD-10-CM

## 2017-01-05 ENCOUNTER — Other Ambulatory Visit (INDEPENDENT_AMBULATORY_CARE_PROVIDER_SITE_OTHER): Payer: BLUE CROSS/BLUE SHIELD

## 2017-01-05 DIAGNOSIS — D582 Other hemoglobinopathies: Secondary | ICD-10-CM | POA: Diagnosis not present

## 2017-01-05 LAB — CBC WITH DIFFERENTIAL/PLATELET
BASOS ABS: 0 10*3/uL (ref 0.0–0.1)
Basophils Relative: 0.4 % (ref 0.0–3.0)
EOS ABS: 0 10*3/uL (ref 0.0–0.7)
Eosinophils Relative: 0.6 % (ref 0.0–5.0)
HEMATOCRIT: 50.7 % (ref 39.0–52.0)
Hemoglobin: 17.3 g/dL — ABNORMAL HIGH (ref 13.0–17.0)
LYMPHS PCT: 21.7 % (ref 12.0–46.0)
Lymphs Abs: 1.3 10*3/uL (ref 0.7–4.0)
MCHC: 34.2 g/dL (ref 30.0–36.0)
MCV: 96.2 fl (ref 78.0–100.0)
MONO ABS: 0.4 10*3/uL (ref 0.1–1.0)
Monocytes Relative: 6.4 % (ref 3.0–12.0)
NEUTROS ABS: 4.3 10*3/uL (ref 1.4–7.7)
Neutrophils Relative %: 70.9 % (ref 43.0–77.0)
PLATELETS: 234 10*3/uL (ref 150.0–400.0)
RBC: 5.27 Mil/uL (ref 4.22–5.81)
RDW: 12.9 % (ref 11.5–15.5)
WBC: 6 10*3/uL (ref 4.0–10.5)

## 2017-01-09 ENCOUNTER — Other Ambulatory Visit: Payer: Self-pay | Admitting: Family Medicine

## 2017-01-09 DIAGNOSIS — D582 Other hemoglobinopathies: Secondary | ICD-10-CM

## 2017-01-30 ENCOUNTER — Other Ambulatory Visit (INDEPENDENT_AMBULATORY_CARE_PROVIDER_SITE_OTHER): Payer: BLUE CROSS/BLUE SHIELD

## 2017-01-30 DIAGNOSIS — D582 Other hemoglobinopathies: Secondary | ICD-10-CM | POA: Diagnosis not present

## 2017-01-31 ENCOUNTER — Telehealth: Payer: Self-pay

## 2017-01-31 DIAGNOSIS — D582 Other hemoglobinopathies: Secondary | ICD-10-CM

## 2017-01-31 LAB — CBC WITH DIFFERENTIAL/PLATELET
BASOS PCT: 0.5 % (ref 0.0–3.0)
Basophils Absolute: 0 10*3/uL (ref 0.0–0.1)
EOS PCT: 0.7 % (ref 0.0–5.0)
Eosinophils Absolute: 0.1 10*3/uL (ref 0.0–0.7)
HCT: 53.7 % — ABNORMAL HIGH (ref 39.0–52.0)
Hemoglobin: 18.3 g/dL (ref 13.0–17.0)
LYMPHS ABS: 1.9 10*3/uL (ref 0.7–4.0)
Lymphocytes Relative: 23.5 % (ref 12.0–46.0)
MCHC: 34 g/dL (ref 30.0–36.0)
MCV: 96.6 fl (ref 78.0–100.0)
MONO ABS: 0.7 10*3/uL (ref 0.1–1.0)
MONOS PCT: 7.9 % (ref 3.0–12.0)
NEUTROS ABS: 5.6 10*3/uL (ref 1.4–7.7)
NEUTROS PCT: 67.4 % (ref 43.0–77.0)
PLATELETS: 260 10*3/uL (ref 150.0–400.0)
RBC: 5.56 Mil/uL (ref 4.22–5.81)
RDW: 12.7 % (ref 11.5–15.5)
WBC: 8.3 10*3/uL (ref 4.0–10.5)

## 2017-01-31 NOTE — Telephone Encounter (Signed)
Okay to be managed by PCP

## 2017-01-31 NOTE — Telephone Encounter (Signed)
Received call from [facility]: Critical Hgb- 18.3.   Routing to DOD and PCP.

## 2017-02-01 NOTE — Telephone Encounter (Signed)
Let pt know his blood count is still elevated. Please make sure the pt has been off of his testosterone. If so, I would like to send him to a hematologist (blood specialist). TY.

## 2017-02-01 NOTE — Telephone Encounter (Signed)
Spoke to the patient and he has been off testosterone since he was told to stop.  He does agree to referral to hematologist

## 2017-02-01 NOTE — Telephone Encounter (Signed)
Referral placed.

## 2017-02-01 NOTE — Addendum Note (Signed)
Addended by: Ames Coupe on: 02/01/2017 04:21 PM   Modules accepted: Orders

## 2017-02-10 ENCOUNTER — Telehealth: Payer: Self-pay | Admitting: Internal Medicine

## 2017-02-10 NOTE — Telephone Encounter (Signed)
Spoke with patient regarding appointment.  I provided him with Date/Time/Location & Phone number

## 2017-02-15 ENCOUNTER — Encounter: Payer: Self-pay | Admitting: Family Medicine

## 2017-02-15 ENCOUNTER — Ambulatory Visit: Payer: BLUE CROSS/BLUE SHIELD | Admitting: Family Medicine

## 2017-02-15 VITALS — BP 118/80 | HR 100 | Temp 98.2°F | Ht 68.0 in | Wt 245.0 lb

## 2017-02-15 DIAGNOSIS — I10 Essential (primary) hypertension: Secondary | ICD-10-CM

## 2017-02-15 DIAGNOSIS — E0590 Thyrotoxicosis, unspecified without thyrotoxic crisis or storm: Secondary | ICD-10-CM | POA: Diagnosis not present

## 2017-02-15 DIAGNOSIS — R109 Unspecified abdominal pain: Secondary | ICD-10-CM | POA: Diagnosis not present

## 2017-02-15 DIAGNOSIS — G8929 Other chronic pain: Secondary | ICD-10-CM | POA: Diagnosis not present

## 2017-02-15 MED ORDER — AMLODIPINE BESYLATE 5 MG PO TABS: ORAL_TABLET | ORAL | 3 refills | Status: DC

## 2017-02-15 NOTE — Patient Instructions (Addendum)
Keep up the good work with your portion control.   I want your HR/pulse <100 on average. If it gets higher, I want to know. We can always go back on the Toprol.   Let us know if you need anything.

## 2017-02-15 NOTE — Progress Notes (Signed)
Chief Complaint  Patient presents with  . Follow-up    6 month    Subjective Kevin Newman is a 46 y.o. male who presents for hypertension follow up. He does not routinely monitor home blood pressures. He is compliant with medications-Norvasc 5 mg daily, chlorthalidone 25 mg daily. Patient has these side effects of medication: none He is sometimes adhering to a healthy diet overall, he has cut down on his portions. Current exercise: none Metoprolol started by endocrinology, wondering if he needs to take it still.  It was initially started to help control his hyperactive thyroid symptoms.  His endocrinologist told him the run by his PCP to see if he ciliated it for blood pressure.  His average pulse has been in the 80s on average over the past 6 months.  Chronic abd pain Intermittent diarrhea and cramping. Some a/w gluten and some with dairy. He has never seen a specialist. No unintentional wt loss, bleeding, nighttime awakenings.    Past Medical History:  Diagnosis Date  . Anxiety   . Essential hypertension 04/22/2016  . Hypogonadism male 08/18/2016   Family History  Problem Relation Age of Onset  . Heart disease Maternal Grandmother 80  . Thyroid disease Mother     Allergies Allergies  Allergen Reactions  . Amoxil [Amoxicillin]   . Sulfa Antibiotics     Review of Systems Cardiovascular: no chest pain Respiratory:  no shortness of breath  Exam BP 118/80 (BP Location: Left Arm, Patient Position: Sitting, Cuff Size: Large)   Pulse 100   Temp 98.2 F (36.8 C) (Oral)   Ht 5\' 8"  (1.727 m)   Wt 245 lb (111.1 kg)   SpO2 97%   BMI 37.25 kg/m  General:  well developed, well nourished, in no apparent distress Skin: warm, no pallor or diaphoresis Eyes: pupils equal and round, sclera anicteric without injection Heart: RRR, no bruits, no LE edema Lungs: clear to auscultation, no accessory muscle use Abd: soft, NT, ND, BS+ Psych: well oriented with normal range of affect  and appropriate judgment/insight  Essential hypertension  Hyperthyroidism  Chronic abdominal pain - Plan: Ambulatory referral to Gastroenterology  Continue Norvasc and chlorthalidone.  Okay to check blood pressure on an as-needed basis. We will take away his metoprolol for now.  If symptoms return, he will let us know and we will restart it. fod map diet discussed and handout given.  Refer to GI for possible colonoscopy. Counseled on diet and exercise- doing OK w portions.  F/u in 6 mo or prn. The patient voiced understanding and agreement to the plan.  [place], DO 02/15/17  4:52 PM

## 2017-02-15 NOTE — Progress Notes (Signed)
Pre visit review using our clinic review tool, if applicable. No additional management support is needed unless otherwise documented below in the visit note. 

## 2017-02-16 ENCOUNTER — Encounter: Payer: Self-pay | Admitting: Internal Medicine

## 2017-03-01 ENCOUNTER — Other Ambulatory Visit: Payer: Self-pay | Admitting: Family Medicine

## 2017-03-01 ENCOUNTER — Encounter: Payer: Self-pay | Admitting: Family Medicine

## 2017-03-01 DIAGNOSIS — Z113 Encounter for screening for infections with a predominantly sexual mode of transmission: Secondary | ICD-10-CM

## 2017-03-02 ENCOUNTER — Other Ambulatory Visit (INDEPENDENT_AMBULATORY_CARE_PROVIDER_SITE_OTHER): Payer: BLUE CROSS/BLUE SHIELD

## 2017-03-02 ENCOUNTER — Other Ambulatory Visit (HOSPITAL_COMMUNITY)
Admission: RE | Admit: 2017-03-02 | Discharge: 2017-03-02 | Disposition: A | Discharge status: Home / Self Care | Payer: BLUE CROSS/BLUE SHIELD | Source: Ambulatory Visit | Attending: Family Medicine | Admitting: Family Medicine

## 2017-03-02 DIAGNOSIS — Z113 Encounter for screening for infections with a predominantly sexual mode of transmission: Secondary | ICD-10-CM | POA: Diagnosis not present

## 2017-03-03 LAB — HIV ANTIBODY (ROUTINE TESTING W REFLEX): HIV: NONREACTIVE

## 2017-03-06 LAB — URINE CYTOLOGY ANCILLARY ONLY
Chlamydia: NEGATIVE
Neisseria Gonorrhea: NEGATIVE
Trichomonas: NEGATIVE

## 2017-03-07 ENCOUNTER — Encounter: Payer: Self-pay | Admitting: Internal Medicine

## 2017-03-07 ENCOUNTER — Telehealth: Payer: Self-pay | Admitting: Internal Medicine

## 2017-03-07 ENCOUNTER — Other Ambulatory Visit: Payer: Self-pay | Admitting: *Deleted

## 2017-03-07 ENCOUNTER — Inpatient Hospital Stay: Payer: BLUE CROSS/BLUE SHIELD

## 2017-03-07 ENCOUNTER — Inpatient Hospital Stay: Payer: BLUE CROSS/BLUE SHIELD | Attending: Internal Medicine | Admitting: Internal Medicine

## 2017-03-07 VITALS — BP 133/85 | HR 94 | Temp 98.2°F | Resp 18 | Ht 64.0 in | Wt 238.7 lb

## 2017-03-07 DIAGNOSIS — F419 Anxiety disorder, unspecified: Secondary | ICD-10-CM | POA: Diagnosis not present

## 2017-03-07 DIAGNOSIS — Z79899 Other long term (current) drug therapy: Secondary | ICD-10-CM | POA: Diagnosis not present

## 2017-03-07 DIAGNOSIS — D45 Polycythemia vera: Secondary | ICD-10-CM | POA: Diagnosis not present

## 2017-03-07 DIAGNOSIS — D582 Other hemoglobinopathies: Secondary | ICD-10-CM

## 2017-03-07 DIAGNOSIS — D751 Secondary polycythemia: Secondary | ICD-10-CM

## 2017-03-07 DIAGNOSIS — I10 Essential (primary) hypertension: Secondary | ICD-10-CM | POA: Diagnosis not present

## 2017-03-07 DIAGNOSIS — E291 Testicular hypofunction: Secondary | ICD-10-CM | POA: Diagnosis not present

## 2017-03-07 LAB — CBC (CANCER CENTER ONLY)
HCT: 55.2 % — ABNORMAL HIGH (ref 38.4–49.9)
Hemoglobin: 19.3 g/dL — ABNORMAL HIGH (ref 13.0–17.1)
MCH: 33.2 pg (ref 27.2–33.4)
MCHC: 35 g/dL (ref 32.0–36.0)
MCV: 95 fL (ref 79.3–98.0)
PLATELETS: 220 10*3/uL (ref 140–400)
RBC: 5.81 MIL/uL (ref 4.20–5.82)
RDW: 12.7 % (ref 11.0–15.6)
WBC Count: 6.7 10*3/uL (ref 4.0–10.3)

## 2017-03-07 NOTE — Patient Instructions (Signed)

## 2017-03-07 NOTE — Progress Notes (Signed)
Kevin Newman presents today for phlebotomy per MD orders. Phlebotomy procedure started at 1420 and ended at 1453 381 cc removed. Patient tolerated procedure well. Pt. offered water and snack. IV needle removed intact.

## 2017-03-07 NOTE — Progress Notes (Signed)
Dr. Mckinley Jewel ok  of 381 cc removed from phlebotomy today.

## 2017-03-07 NOTE — Telephone Encounter (Signed)
Gave avs and calendar for January  °

## 2017-03-07 NOTE — Progress Notes (Signed)
[place] Telephone:([phone]   Fax:([phone]  CONSULT NOTE  REFERRING PHYSICIAN: Dr. Riki Sheer  REASON FOR CONSULTATION:  47 years old white male with polycythemia.  HPI Kevin Newman is a 47 y.o. male with past medical history significant for hypertension, anxiety and hypogonadism and has been on treatment with testosterone since the fall 2017.  The patient was seen by his primary care physician for routine evaluation and CBC on 12/22/2016 showed elevated hemoglobin of 17.6 and hematocrit 52.8%.  His primary care physician discontinued his treatment with testosterone.  Repeat CBC on January 05, 2017 showed persistent elevation of his hemoglobin at 17.3 and hematocrit 50.7%.  The patient continues on close monitoring without any androgen treatment and repeat CBC on January 30, 2017 showed further evaluation of his hemoglobin to 18.3 and hematocrit 53.7%.  The patient was referred to me today for further evaluation and recommendation regarding his condition. The patient is feeling fine with no specific complaints.  He is currently on treatment for hyperthyroidism.  He denied having any significant weight loss or night sweats.  He has no nausea, vomiting, diarrhea or constipation.  He has no concerning chest pain, shortness of breath, cough or hemoptysis.  He has no headache or visual changes. Family history is unremarkable and his father and mother are healthy.  Maternal grandmother had heart disease. The patient is divorced and has no children.  He works at [employer].  He has no history for smoking but drinks alcohol occasionally and no history of drug abuse.  HPI  Past Medical History:  Diagnosis Date  . Anxiety   . Essential hypertension 04/22/2016  . Hypogonadism male 08/18/2016    Past Surgical History:  Procedure Laterality Date  . NO PAST SURGERIES      Family History  Problem Relation Age of Onset  . Heart disease Maternal  Grandmother 80  . Thyroid disease Mother     Social History Social History   Tobacco Use  . Smoking status: Never Smoker  . Smokeless tobacco: Never Used  Substance Use Topics  . Alcohol use: No  . Drug use: No    Allergies  Allergen Reactions  . Amoxil [Amoxicillin]   . Sulfa Antibiotics     Current Outpatient Medications  Medication Sig Dispense Refill  . amLODipine (NORVASC) 5 MG tablet TAKE 1 TABLET(5 MG) BY MOUTH DAILY 90 tablet 3  . chlorthalidone (HYGROTON) 25 MG tablet Take 1 tablet (25 mg total) by mouth daily. 90 tablet 1  . Cholecalciferol (VITAMIN D-3 PO) Take by mouth.    . Cobalamine Combinations (B12 FOLATE) 800-800 MCG CAPS Take 800 mcg by mouth daily.    . Coenzyme Q10 (COQ10) 100 MG CAPS Take 100 mg by mouth daily.    Marland Kitchen Dexchlorphen-PSE-Methscop (D-HIST D PO) Take by mouth.    Marland Kitchen DHEA 25 MG CAPS Take by mouth.    . Garlic 144 MG TABS Take 500 mg by mouth daily.    . Magnesium 400 MG TABS Take 400 mg by mouth 2 (two) times daily.    . Methylcobalamin (METHYL B-12 PO) Take by mouth.    . Multiple Vitamins-Minerals (MULTIVITAMIN ADULT PO) Take by mouth.    Marland Kitchen OVER THE COUNTER MEDICATION BIOTIC DETOXIFICATION SUPPORT.  Take 1 tablet daily    . Taurine 1000 MG CAPS Take 2,000 mg by mouth 3 (three) times daily.    . Testosterone 30 MG/ACT SOLN Apply 2 clicks (3.1VQ) once a day  to forehead, clavicle, and inner arms rotating sites. Testerone 300MG /ML LIP 90 mL 0  . vitamin E 1000 UNIT capsule Take 1,000 Units by mouth daily.     No current facility-administered medications for this visit.     Review of Systems  Constitutional: negative Eyes: negative Ears, nose, mouth, throat, and face: negative Respiratory: negative Cardiovascular: negative Gastrointestinal: negative Genitourinary:negative Integument/breast: negative Hematologic/lymphatic: negative Musculoskeletal:negative Neurological: negative Behavioral/Psych: negative Endocrine:  negative Allergic/Immunologic: negative  Physical Exam  UYQ:IHKVQ, healthy, no distress, well nourished, well developed and anxious SKIN: skin color, texture, turgor are normal, no rashes or significant lesions HEAD: Normocephalic, No masses, lesions, tenderness or abnormalities EYES: normal, PERRLA, Conjunctiva are pink and non-injected EARS: External ears normal, Canals clear OROPHARYNX:no exudate, no erythema and lips, buccal mucosa, and tongue normal  NECK: supple, no adenopathy, no JVD LYMPH:  no palpable lymphadenopathy, no hepatosplenomegaly LUNGS: clear to auscultation , and palpation HEART: regular rate & rhythm, no murmurs and no gallops ABDOMEN:abdomen soft, non-tender, normal bowel sounds and no masses or organomegaly BACK: Back symmetric, no curvature., No CVA tenderness EXTREMITIES:no joint deformities, effusion, or inflammation, no edema, no skin discoloration  NEURO: alert & oriented x 3 with fluent speech, no focal motor/sensory deficits  PERFORMANCE STATUS: ECOG 0  LABORATORY DATA: Lab Results  Component Value Date   WBC 8.3 01/30/2017   HGB 18.3 Repeated and verified X2. (HH) 01/30/2017   HCT 55.2 (H) 03/07/2017   MCV 95.0 03/07/2017   PLT 260.0 01/30/2017      Chemistry      Component Value Date/Time   NA 138 09/22/2016 0834   K 3.6 09/22/2016 0834   CL 100 09/22/2016 0834   CO2 29 09/22/2016 0834   BUN 13 09/22/2016 0834   CREATININE 1.23 09/22/2016 0834      Component Value Date/Time   CALCIUM 9.6 09/22/2016 0834   ALKPHOS 62 04/07/2016 0932   AST 23 04/07/2016 0932   ALT 11 (L) 04/07/2016 0932   BILITOT 2.4 (H) 04/07/2016 0932       RADIOGRAPHIC STUDIES: No results found.  ASSESSMENT: This is a very pleasant 47 years old white male with persistent polycythemia suspicious for polycythemia vera but secondary polycythemia cannot be excluded at this point.  PLAN: I had a lengthy discussion with the patient today about his condition and  treatment options. Repeat CBC today showed further increase in his hemoglobin and hematocrit.  His hemoglobin was 19.3 and hematocrit 55.2.  The patient has normal white blood count as well as normal platelets count. The patient voices understanding of current disease status and treatment options and is in agreement with the current care plan. I discussed with the patient and his condition and further investigation to rule out polycythemia vera.  I will order JAK-2 mutation panel as well as erythropoietin level. I will arrange for the patient to have phlebotomy performed today and also for the next 2 weeks until his hematocrit is around 45%. I will see the patient back for follow-up visit in 2 weeks for reevaluation and more discussion of his treatment options based on the pending lab results. He was advised against restarting testosterone treatment at this point. The patient was also advised to call immediately if he has any concerning symptoms in the interval. All questions were answered. The patient knows to call the clinic with any problems, questions or concerns. We can certainly see the patient much sooner if necessary. Thank you so much for allowing me to participate in  the care of Kevin Newman. I will continue to follow up the patient with you and assist in his care.  I spent 40 minutes counseling the patient face to face. The total time spent in the appointment was 60 minutes.  Disclaimer: This note was dictated with voice recognition software. Similar sounding words can inadvertently be transcribed and may not be corrected upon review.   Eilleen Kempf March 07, 2017, 12:02 PM

## 2017-03-07 NOTE — Progress Notes (Signed)
CBC ordered possible phlebotomy. appt request made to scheduling.

## 2017-03-09 LAB — ERYTHROPOIETIN: Erythropoietin: 11.4 m[IU]/mL (ref 2.6–18.5)

## 2017-03-14 ENCOUNTER — Inpatient Hospital Stay: Payer: BLUE CROSS/BLUE SHIELD

## 2017-03-14 DIAGNOSIS — F419 Anxiety disorder, unspecified: Secondary | ICD-10-CM | POA: Diagnosis not present

## 2017-03-14 DIAGNOSIS — I10 Essential (primary) hypertension: Secondary | ICD-10-CM | POA: Diagnosis not present

## 2017-03-14 DIAGNOSIS — Z79899 Other long term (current) drug therapy: Secondary | ICD-10-CM | POA: Diagnosis not present

## 2017-03-14 DIAGNOSIS — D751 Secondary polycythemia: Secondary | ICD-10-CM | POA: Diagnosis not present

## 2017-03-14 DIAGNOSIS — E291 Testicular hypofunction: Secondary | ICD-10-CM | POA: Diagnosis not present

## 2017-03-14 NOTE — Patient Instructions (Signed)

## 2017-03-21 ENCOUNTER — Encounter: Payer: Self-pay | Admitting: Internal Medicine

## 2017-03-21 ENCOUNTER — Inpatient Hospital Stay: Payer: BLUE CROSS/BLUE SHIELD | Admitting: Internal Medicine

## 2017-03-21 ENCOUNTER — Inpatient Hospital Stay: Payer: BLUE CROSS/BLUE SHIELD

## 2017-03-21 VITALS — BP 129/81 | HR 88 | Temp 97.8°F | Resp 18 | Ht 64.0 in | Wt 245.0 lb

## 2017-03-21 DIAGNOSIS — I10 Essential (primary) hypertension: Secondary | ICD-10-CM | POA: Diagnosis not present

## 2017-03-21 DIAGNOSIS — D751 Secondary polycythemia: Secondary | ICD-10-CM | POA: Diagnosis not present

## 2017-03-21 DIAGNOSIS — E291 Testicular hypofunction: Secondary | ICD-10-CM | POA: Diagnosis not present

## 2017-03-21 DIAGNOSIS — Z79899 Other long term (current) drug therapy: Secondary | ICD-10-CM

## 2017-03-21 DIAGNOSIS — D582 Other hemoglobinopathies: Secondary | ICD-10-CM

## 2017-03-21 DIAGNOSIS — D45 Polycythemia vera: Secondary | ICD-10-CM

## 2017-03-21 DIAGNOSIS — F419 Anxiety disorder, unspecified: Secondary | ICD-10-CM

## 2017-03-21 LAB — CBC WITH DIFFERENTIAL/PLATELET
BASOS ABS: 0 10*3/uL (ref 0.0–0.1)
Basophils Relative: 1 %
EOS PCT: 1 %
Eosinophils Absolute: 0.1 10*3/uL (ref 0.0–0.5)
HCT: 44.8 % (ref 38.4–49.9)
Hemoglobin: 15.3 g/dL (ref 13.0–17.1)
LYMPHS PCT: 28 %
Lymphs Abs: 1.8 10*3/uL (ref 0.9–3.3)
MCH: 32.7 pg (ref 27.2–33.4)
MCHC: 34.2 g/dL (ref 32.0–36.0)
MCV: 95.7 fL (ref 79.3–98.0)
MONO ABS: 0.5 10*3/uL (ref 0.1–0.9)
Monocytes Relative: 7 %
Neutro Abs: 4.1 10*3/uL (ref 1.5–6.5)
Neutrophils Relative %: 63 %
PLATELETS: 233 10*3/uL (ref 140–400)
RBC: 4.68 MIL/uL (ref 4.20–5.82)
RDW: 13.1 % (ref 11.0–15.6)
WBC: 6.5 10*3/uL (ref 4.0–10.3)

## 2017-03-21 NOTE — Progress Notes (Signed)
[place] Telephone:(336) [phone]   Fax:(336) [phone]  OFFICE PROGRESS NOTE  [name], DO [address] [address]  DIAGNOSIS: Polycythemia suspicious for polycythemia vera.  CURRENT THERAPY: Phlebotomy on as-needed basis.  Last one was performed on March 14, 2017.  INTERVAL HISTORY: Kevin Newman 47 y.o. male returns to the clinic today for follow-up visit.  The patient under went phlebotomy twice for his elevated hemoglobin and hematocrit.  He is feeling much better with no concerning complaints.  He denied having any chest pain, shortness of breath, cough or hemoptysis.  He denied having any weight loss or night sweats.  He has no nausea, vomiting, diarrhea or constipation.  He is here today for evaluation and repeat blood work.  MEDICAL HISTORY: Past Medical History:  Diagnosis Date  . Anxiety   . Essential hypertension 04/22/2016  . Hypogonadism male 08/18/2016    ALLERGIES:  is allergic to amoxil [amoxicillin] and sulfa antibiotics.  MEDICATIONS:  Current Outpatient Medications  Medication Sig Dispense Refill  . amLODipine (NORVASC) 5 MG tablet TAKE 1 TABLET(5 MG) BY MOUTH DAILY 90 tablet 3  . chlorthalidone (HYGROTON) 25 MG tablet Take 1 tablet (25 mg total) by mouth daily. 90 tablet 1  . Cholecalciferol (VITAMIN D-3 PO) Take by mouth.    . Cobalamine Combinations (B12 FOLATE) 800-800 MCG CAPS Take 800 mcg by mouth daily.    . Coenzyme Q10 (COQ10) 100 MG CAPS Take 100 mg by mouth daily.    Marland Kitchen Dexchlorphen-PSE-Methscop (D-HIST D PO) Take by mouth.    Marland Kitchen DHEA 25 MG CAPS Take by mouth.    . Garlic 361 MG TABS Take 500 mg by mouth daily.    . Magnesium 400 MG TABS Take 400 mg by mouth 2 (two) times daily.    . Methylcobalamin (METHYL B-12 PO) Take by mouth.    . Multiple Vitamins-Minerals (MULTIVITAMIN ADULT PO) Take by mouth.    Marland Kitchen OVER THE COUNTER MEDICATION BIOTIC DETOXIFICATION SUPPORT.  Take 1 tablet daily     . Taurine 1000 MG CAPS Take 2,000 mg by mouth 3 (three) times daily.    . Testosterone 30 MG/ACT SOLN Apply 2 clicks (4.4RX) once a day to forehead, clavicle, and inner arms rotating sites. Testerone 300MG /ML LIP 90 mL 0  . vitamin E 1000 UNIT capsule Take 1,000 Units by mouth daily.     No current facility-administered medications for this visit.     SURGICAL HISTORY:  Past Surgical History:  Procedure Laterality Date  . NO PAST SURGERIES      REVIEW OF SYSTEMS:  A comprehensive review of systems was negative.   PHYSICAL EXAMINATION: General appearance: alert, cooperative and no distress Head: Normocephalic, without obvious abnormality, atraumatic Neck: no adenopathy, no JVD, supple, symmetrical, trachea midline and thyroid not enlarged, symmetric, no tenderness/mass/nodules Lymph nodes: Cervical, supraclavicular, and axillary nodes normal. Resp: clear to auscultation bilaterally Back: symmetric, no curvature. ROM normal. No CVA tenderness. Cardio: regular rate and rhythm, S1, S2 normal, no murmur, click, rub or gallop GI: soft, non-tender; bowel sounds normal; no masses,  no organomegaly Extremities: extremities normal, atraumatic, no cyanosis or edema  ECOG PERFORMANCE STATUS: 0 - Asymptomatic  Blood pressure 129/81, pulse 88, temperature 97.8 F (36.6 C), temperature source Oral, resp. rate 18, height 5\' 4"  (1.626 m), weight 245 lb (111.1 kg), SpO2 98 %.  LABORATORY DATA: Lab Results  Component Value Date   WBC 6.5 03/21/2017  HGB 15.3 03/21/2017   HCT 44.8 03/21/2017   MCV 95.7 03/21/2017   PLT 233 03/21/2017      Chemistry      Component Value Date/Time   NA 138 09/22/2016 0834   K 3.6 09/22/2016 0834   CL 100 09/22/2016 0834   CO2 29 09/22/2016 0834   BUN 13 09/22/2016 0834   CREATININE 1.23 09/22/2016 0834      Component Value Date/Time   CALCIUM 9.6 09/22/2016 0834   ALKPHOS 62 04/07/2016 0932   AST 23 04/07/2016 0932   ALT 11 (L) 04/07/2016 0932     BILITOT 2.4 (H) 04/07/2016 0932       RADIOGRAPHIC STUDIES: No results found.  ASSESSMENT AND PLAN: This is a very pleasant 47 years old white male with polycythemia likely reactive in nature secondary to his previous hormonal treatment but polycythemia vera cannot be completely excluded at this point. I ordered JAK 2 mutation to be performed for further evaluation of his condition.  His erythropoietin level was normal. The patient received phlebotomy weekly x2 and his hemoglobin hematocrit has significantly improved. I recommended for him to continue in observation for now with repeat CBC in 6 weeks for reevaluation of his condition. He was advised to call immediately if he has any concerning symptoms in the interval. The patient voices understanding of current disease status and treatment options and is in agreement with the current care plan.  All questions were answered. The patient knows to call the clinic with any problems, questions or concerns. We can certainly see the patient much sooner if necessary.  I spent 10 minutes counseling the patient face to face. The total time spent in the appointment was 15 minutes.  Disclaimer: This note was dictated with voice recognition software. Similar sounding words can inadvertently be transcribed and may not be corrected upon review.

## 2017-03-24 LAB — JAK 2 V617F (GENPATH)

## 2017-03-31 LAB — JAK2 (INCLUDING V617F AND EXON 12), MPL,& CALR W/RFL MPN PANEL (NGS)

## 2017-04-04 ENCOUNTER — Other Ambulatory Visit: Payer: Self-pay | Admitting: Family Medicine

## 2017-04-04 DIAGNOSIS — I10 Essential (primary) hypertension: Secondary | ICD-10-CM

## 2017-04-06 ENCOUNTER — Ambulatory Visit: Payer: BLUE CROSS/BLUE SHIELD | Admitting: Internal Medicine

## 2017-04-06 ENCOUNTER — Encounter: Payer: Self-pay | Admitting: Internal Medicine

## 2017-04-06 ENCOUNTER — Other Ambulatory Visit (INDEPENDENT_AMBULATORY_CARE_PROVIDER_SITE_OTHER): Payer: BLUE CROSS/BLUE SHIELD

## 2017-04-06 VITALS — BP 108/86 | HR 100 | Ht 67.5 in | Wt 243.5 lb

## 2017-04-06 DIAGNOSIS — R109 Unspecified abdominal pain: Secondary | ICD-10-CM

## 2017-04-06 DIAGNOSIS — R197 Diarrhea, unspecified: Secondary | ICD-10-CM

## 2017-04-06 LAB — IGA: IGA: 240 mg/dL (ref 68–378)

## 2017-04-06 MED ORDER — PEG-KCL-NACL-NASULF-NA ASC-C 140 G PO SOLR: 1.0000 | Freq: Once | ORAL | 0 refills | Status: AC

## 2017-04-06 NOTE — Patient Instructions (Signed)
Your physician has requested that you go to the basement for the following lab work before leaving today:  TTG, IGA  Continue Metamucil  Avoid sugar substitutes  You have been scheduled for a colonoscopy. Please follow written instructions given to you at your visit today.  Please pick up your prep supplies at the pharmacy within the next 1-3 days. If you use inhalers (even only as needed), please bring them with you on the day of your procedure. Your physician has requested that you go to www.startemmi.com and enter the access code given to you at your visit today. This web site gives a general overview about your procedure. However, you should still follow specific instructions given to you by our office regarding your preparation for the procedure.

## 2017-04-07 ENCOUNTER — Encounter: Payer: Self-pay | Admitting: Internal Medicine

## 2017-04-07 LAB — TISSUE TRANSGLUTAMINASE, IGA: (TTG) AB, IGA: 1 U/mL

## 2017-04-07 NOTE — Progress Notes (Signed)
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 Riki Sheer with chief complaint of chronic diarrhea and abdominal pain which has been worsening. The patient reports problems with abdominal complaints dating back to his college days. He describes intermittent postprandial abdominal cramping with urgency and loose stools. The frequency has increased over the past 5-6 years. Currently occurring at least once per week. At other times she describes abdominal bloating. He thinks that his symptoms are exacerbated by dairy products and sugar-containing foods. Rarely does he have a day without GI distress of some sort. He has noticed a taking fiber helps, though he has been doing this sporadically. He typically has one to 2 bowel movements per day which are formed with normal color and sink. No nocturnal symptoms. He has had 10 pound weight loss over the past year but he attributes this to diet. He does use sugar substitutes. He has not been evaluated for these problems previously. There is no family history of inflammatory bowel disease or colorectal neoplasia. He denies incontinence. GI review of systems is otherwise negative. Review of outside laboratories from January 2019 reveals normal CBC with hemoglobin 15.5. Review of outside radiology shows unremarkable chest x-ray and thyroid scan in 2018.  REVIEW OF SYSTEMS:  All non-GI ROS negative unless otherwise stated in the history of present illness except for fatigue  Past Medical History:  Diagnosis Date  . Anxiety   . Essential hypertension 04/22/2016  . Hypogonadism male 08/18/2016  . Sleep apnea    boderline, have CPAP    Past Surgical History:  Procedure Laterality Date  . NO PAST SURGERIES      Social History Pope Brunty  reports that  has never smoked. he has never used smokeless tobacco. He reports  that he drinks alcohol. He reports that he does not use drugs.  family history includes Breast cancer in his paternal grandmother; Cancer in his maternal uncle; Gallbladder disease in his father and mother; Heart Problems in his father; Heart attack in his maternal grandfather; Heart disease (age of onset: 70) in his maternal grandmother; Prostate cancer in his paternal grandfather; Stroke in his maternal grandfather; Thyroid disease in his mother.  Allergies  Allergen Reactions  . Amoxil [Amoxicillin]   . Sulfa Antibiotics        PHYSICAL EXAMINATION: Vital signs: BP 108/86 (BP Location: Left Arm, Patient Position: Sitting, Cuff Size: Normal)   Pulse 100   Ht 5' 7.5" (1.715 m) Comment: height measured without shoes  Wt 243 lb 8 oz (110.5 kg)   BMI 37.57 kg/m   Constitutional: generally well-appearing, no acute distress Psychiatric: alert and oriented x3, cooperative Eyes: extraocular movements intact, anicteric, conjunctiva pink Mouth: oral pharynx moist, no lesions Neck: supple no lymphadenopathy Cardiovascular: heart regular rate and rhythm, no murmur Lungs: clear to auscultation bilaterally Abdomen: soft, obese, nontender, nondistended, no obvious ascites, no peritoneal signs, normal bowel sounds, no organomegaly Rectal: Deferred until colonoscopy Extremities: no clubbing, cyanosis, or lower extremity edema bilaterally Skin: no lesions on visible extremities Neuro: No focal deficits. Cranial nerves intact. No asterixis.   ASSESSMENT:  #1. Chronic problems with abdominal bloating and intermittent postprandial urgency with abdominal cramping and diarrhea. Given the description, frequency, and duration I suspect irritable bowel syndrome. Other possibilities include celiac disease, bile salt related diarrhea, or less likely, microscopic colitis. His use of sugar substitutes may  be playing a role. No alarm features.  PLAN:  #1. Screen for celiac disease with tissue transaminase  antibody IgA and serum IgA level #2. Recommend using fiber daily as opposed to sporadically #3. Discontinue all sugar substitutes #4. Schedule colonoscopy with biopsies to more thoroughly evaluate his symptom complex. Rule out microscopic colitis.The nature of the procedure, as well as the risks, benefits, and alternatives were carefully and thoroughly reviewed with the patient. Ample time for discussion and questions allowed. The patient understood, was satisfied, and agreed to proceed. #5. Follow-up and plans to be determined after the above  A copy of this consultation note has been sent to Dr. Nani Ravens

## 2017-04-19 ENCOUNTER — Encounter: Payer: Self-pay | Admitting: Internal Medicine

## 2017-04-24 DIAGNOSIS — F4321 Adjustment disorder with depressed mood: Secondary | ICD-10-CM | POA: Diagnosis not present

## 2017-04-27 ENCOUNTER — Encounter: Payer: Self-pay | Admitting: Internal Medicine

## 2017-04-27 ENCOUNTER — Ambulatory Visit (AMBULATORY_SURGERY_CENTER): Payer: BLUE CROSS/BLUE SHIELD | Admitting: Internal Medicine

## 2017-04-27 VITALS — BP 109/73 | HR 88 | Temp 97.5°F | Resp 15 | Ht 67.0 in | Wt 243.0 lb

## 2017-04-27 DIAGNOSIS — R197 Diarrhea, unspecified: Secondary | ICD-10-CM

## 2017-04-27 DIAGNOSIS — R109 Unspecified abdominal pain: Secondary | ICD-10-CM

## 2017-04-27 DIAGNOSIS — K5730 Diverticulosis of large intestine without perforation or abscess without bleeding: Secondary | ICD-10-CM | POA: Diagnosis not present

## 2017-04-27 DIAGNOSIS — Z1211 Encounter for screening for malignant neoplasm of colon: Secondary | ICD-10-CM | POA: Diagnosis not present

## 2017-04-27 MED ORDER — SODIUM CHLORIDE 0.9 % IV SOLN: 500.0000 mL | Freq: Once | INTRAVENOUS | Status: DC

## 2017-04-27 NOTE — Op Note (Signed)
Kevin Newman Patient Name: Kevin Newman Procedure Date: 04/27/2017 2:18 PM MRN: [id number] Endoscopist: Docia Chuck. Henrene Pastor , MD Age: 47 Referring MD:  Date of Birth: 08-05-1970 Gender: Male Account #: [id number] Procedure:                Colonoscopy, with biopsies Indications:              Abdominal pain, Chronic diarrhea Medicines:                Monitored Anesthesia Care Procedure:                Pre-Anesthesia Assessment:                           - Prior to the procedure, a History and Physical                            was performed, and patient medications and                            allergies were reviewed. The patient's tolerance of                            previous anesthesia was also reviewed. The risks                            and benefits of the procedure and the sedation                            options and risks were discussed with the patient.                            All questions were answered, and informed consent                            was obtained. Prior Anticoagulants: The patient has                            taken no previous anticoagulant or antiplatelet                            agents. ASA Grade Assessment: II - A patient with                            mild systemic disease. After reviewing the risks                            and benefits, the patient was deemed in                            satisfactory condition to undergo the procedure.                           After obtaining informed consent, the colonoscope  was passed under direct vision. Throughout the                            procedure, the patient's blood pressure, pulse, and                            oxygen saturations were monitored continuously. The                            Colonoscope was introduced through the anus and                            advanced to the the cecum, identified by                            appendiceal orifice and  ileocecal valve. The                            ileocecal valve, appendiceal orifice, and rectum                            were photographed. The quality of the bowel                            preparation was excellent. The colonoscopy was                            performed without difficulty. The patient tolerated                            the procedure well. The bowel preparation used was                            SUPREP. Scope In: 2:22:32 PM Scope Out: 2:34:24 PM Scope Withdrawal Time: 0 hours 8 minutes 36 seconds  Total Procedure Duration: 0 hours 11 minutes 52 seconds  Findings:                 Multiple small-mouthed diverticula were found in                            the sigmoid colon.                           Internal hemorrhoids were found during                            retroflexion. The hemorrhoids were small.                           The entire examined colon appeared otherwise normal                            on direct and retroflexion views. Biopsies for  histology were taken with a cold forceps from the                            entire colon for evaluation of microscopic colitis. Complications:            No immediate complications. Estimated blood loss:                            None. Estimated Blood Loss:     Estimated blood loss: none. Impression:               - Diverticulosis in the sigmoid colon.                           - Internal hemorrhoids.                           - The entire examined colon is otherwise normal on                            direct and retroflexion views. Recommendation:           - Repeat colonoscopy in 10 years for screening                            purposes.                           - Patient has a contact number available for                            emergencies. The signs and symptoms of potential                            delayed complications were discussed with the                             patient. Return to normal activities tomorrow.                            Written discharge instructions were provided to the                            patient.                           - Resume previous diet.                           - Continue present medications.                           - Await pathology results.                           - Continue regular fiber supplementation and  avoidance of sugar substitutes                           - Office follow-up with Dr. Henrene Pastor in about 6 weeks Docia Chuck. Henrene Pastor, MD 04/27/2017 2:40:28 PM This report has been signed electronically.

## 2017-04-27 NOTE — Progress Notes (Signed)
Called to room to assist during endoscopic procedure.  Patient ID and intended procedure confirmed with present staff. Received instructions for my participation in the procedure from the performing physician.  

## 2017-04-27 NOTE — Progress Notes (Signed)
A and O x3. Report to RN. Tolerated MAC anesthesia well.

## 2017-04-27 NOTE — Progress Notes (Signed)
Pt's states no medical or surgical changes since previsit or office visit. 

## 2017-04-27 NOTE — Patient Instructions (Signed)
YOU HAD AN ENDOSCOPIC PROCEDURE TODAY AT THE [facility]:   Refer to the procedure report that was given to you for any specific questions about what was found during the examination.  If the procedure report does not answer your questions, please call your gastroenterologist to clarify.  If you requested that your care partner not be given the details of your procedure findings, then the procedure report has been included in a sealed envelope for you to review at your convenience later.  YOU SHOULD EXPECT: Some feelings of bloating in the abdomen. Passage of more gas than usual.  Walking can help get rid of the air that was put into your GI tract during the procedure and reduce the bloating. If you had a lower endoscopy (such as a colonoscopy or flexible sigmoidoscopy) you may notice spotting of blood in your stool or on the toilet paper. If you underwent a bowel prep for your procedure, you may not have a normal bowel movement for a few days.  Please Note:  You might notice some irritation and congestion in your nose or some drainage.  This is from the oxygen used during your procedure.  There is no need for concern and it should clear up in a day or so.  SYMPTOMS TO REPORT IMMEDIATELY:   Following lower endoscopy (colonoscopy or flexible sigmoidoscopy):  Excessive amounts of blood in the stool  Significant tenderness or worsening of abdominal pains  Swelling of the abdomen that is new, acute  Fever of 100F or higher  For urgent or emergent issues, a gastroenterologist can be reached at any hour by calling [phone].   DIET:  We do recommend a small meal at first, but then you may proceed to your regular diet.  Drink plenty of fluids but you should avoid alcoholic beverages for 24 hours.  ACTIVITY:  You should plan to take it easy for the rest of today and you should NOT DRIVE or use heavy machinery until tomorrow (because of the sedation medicines used during the test).     FOLLOW UP: Our staff will call the number listed on your records the next business day following your procedure to check on you and address any questions or concerns that you may have regarding the information given to you following your procedure. If we do not reach you, we will leave a message.  However, if you are feeling well and you are not experiencing any problems, there is no need to return our call.  We will assume that you have returned to your regular daily activities without incident.  If any biopsies were taken you will be contacted by phone or by letter within the next 1-3 weeks.  Please call us at [phone] if you have not heard about the biopsies in 3 weeks.    SIGNATURES/CONFIDENTIALITY: You and/or your care partner have signed paperwork which will be entered into your electronic medical record.  These signatures attest to the fact that that the information above on your After Visit Summary has been reviewed and is understood.  Full responsibility of the confidentiality of this discharge information lies with you and/or your care-partner. 

## 2017-04-28 ENCOUNTER — Telehealth: Payer: Self-pay | Admitting: *Deleted

## 2017-04-28 NOTE — Telephone Encounter (Signed)
  Follow up Call-  Call back number 04/27/2017  Post procedure Call Back phone  # [phone]  Permission to leave phone message Yes  Some recent data might be hidden     Patient questions:  Do you have a fever, pain , or abdominal swelling? No. Pain Score  0 *  Have you tolerated food without any problems? Yes.    Have you been able to return to your normal activities? Yes.    Do you have any questions about your discharge instructions: Diet   No. Medications  No. Follow up visit  No.  Do you have questions or concerns about your Care? No.  Actions: * If pain score is 4 or above: No action needed, pain <4.

## 2017-05-02 ENCOUNTER — Inpatient Hospital Stay: Payer: BLUE CROSS/BLUE SHIELD | Attending: Internal Medicine

## 2017-05-02 ENCOUNTER — Telehealth: Payer: Self-pay | Admitting: Internal Medicine

## 2017-05-02 ENCOUNTER — Inpatient Hospital Stay (HOSPITAL_BASED_OUTPATIENT_CLINIC_OR_DEPARTMENT_OTHER): Payer: BLUE CROSS/BLUE SHIELD | Admitting: Internal Medicine

## 2017-05-02 ENCOUNTER — Encounter: Payer: Self-pay | Admitting: Internal Medicine

## 2017-05-02 VITALS — BP 129/94 | HR 92 | Temp 98.5°F | Resp 18 | Ht 67.0 in | Wt 243.1 lb

## 2017-05-02 DIAGNOSIS — D751 Secondary polycythemia: Secondary | ICD-10-CM

## 2017-05-02 DIAGNOSIS — E291 Testicular hypofunction: Secondary | ICD-10-CM | POA: Diagnosis not present

## 2017-05-02 DIAGNOSIS — Z79899 Other long term (current) drug therapy: Secondary | ICD-10-CM

## 2017-05-02 DIAGNOSIS — F419 Anxiety disorder, unspecified: Secondary | ICD-10-CM | POA: Insufficient documentation

## 2017-05-02 DIAGNOSIS — I10 Essential (primary) hypertension: Secondary | ICD-10-CM | POA: Insufficient documentation

## 2017-05-02 LAB — CBC WITH DIFFERENTIAL (CANCER CENTER ONLY)
BASOS ABS: 0 10*3/uL (ref 0.0–0.1)
BASOS PCT: 1 %
EOS ABS: 0.1 10*3/uL (ref 0.0–0.5)
Eosinophils Relative: 1 %
HEMATOCRIT: 48.2 % (ref 38.4–49.9)
Hemoglobin: 16.4 g/dL (ref 13.0–17.1)
Lymphocytes Relative: 31 %
Lymphs Abs: 1.8 10*3/uL (ref 0.9–3.3)
MCH: 32.4 pg (ref 27.2–33.4)
MCHC: 34 g/dL (ref 32.0–36.0)
MCV: 95.3 fL (ref 79.3–98.0)
MONO ABS: 0.3 10*3/uL (ref 0.1–0.9)
MONOS PCT: 6 %
NEUTROS ABS: 3.5 10*3/uL (ref 1.5–6.5)
Neutrophils Relative %: 61 %
Platelet Count: 250 10*3/uL (ref 140–400)
RBC: 5.06 MIL/uL (ref 4.20–5.82)
RDW: 12.4 % (ref 11.0–14.6)
WBC Count: 5.8 10*3/uL (ref 4.0–10.3)

## 2017-05-02 NOTE — Telephone Encounter (Signed)
Appointments scheduled AVS/Calendar printed per 3/5 los °

## 2017-05-02 NOTE — Progress Notes (Signed)
[place] [phone]   [phone]  OFFICE PROGRESS NOTE  [name], DO [address] [address]  DIAGNOSIS: Reactive polycythemia.  Jak 2 mutation panel was negative.  CURRENT THERAPY: Phlebotomy on as-needed basis.  Last one was performed on March 14, 2017.  INTERVAL HISTORY: Kevin Newman 47 y.o. male returns to the clinic today for follow-up visit.  The patient is feeling fine today with no specific complaints.  He denied having any chest pain, shortness of breath, cough or hemoptysis.  He denied having any weight loss or night sweats.  He has no fatigue or weakness.  He denied having any nausea, vomiting, diarrhea or constipation.  He is here today for evaluation and repeat blood work.  MEDICAL HISTORY: Past Medical History:  Diagnosis Date  . Anxiety   . Essential hypertension 04/22/2016  . Hypogonadism male 08/18/2016  . Sleep apnea    boderline, have CPAP    ALLERGIES:  is allergic to amoxil [amoxicillin] and sulfa antibiotics.  MEDICATIONS:  Current Outpatient Medications  Medication Sig Dispense Refill  . amLODipine (NORVASC) 5 MG tablet TAKE 1 TABLET(5 MG) BY MOUTH DAILY 90 tablet 3  . chlorthalidone (HYGROTON) 25 MG tablet TAKE 1 TABLET(25 MG) BY MOUTH DAILY 90 tablet 0  . Cholecalciferol (VITAMIN D-3 PO) Take by mouth.    . Cobalamine Combinations (B12 FOLATE) 800-800 MCG CAPS Take 800 mcg by mouth daily.    . Coenzyme Q10 (COQ10) 100 MG CAPS Take 100 mg by mouth daily.    Marland Kitchen Dexchlorphen-PSE-Methscop (D-HIST D PO) Take by mouth.    Marland Kitchen DHEA 25 MG CAPS Take by mouth.    . Garlic 893 MG TABS Take 500 mg by mouth daily.    . Magnesium 400 MG TABS Take 400 mg by mouth 2 (two) times daily.    . Multiple Vitamins-Minerals (MULTIVITAMIN ADULT PO) Take by mouth.    Marland Kitchen OVER THE COUNTER MEDICATION BIOTIC DETOXIFICATION SUPPORT.  Take 1 tablet daily    . Taurine 1000 MG CAPS Take 2,000 mg by  mouth 3 (three) times daily.    . vitamin E 1000 UNIT capsule Take 1,000 Units by mouth daily.    Marland Kitchen PLENVU 140 g SOLR      Current Facility-Administered Medications  Medication Dose Route Frequency Provider Last Rate Last Dose  . 0.9 %  sodium chloride infusion  500 mL Intravenous Once Irene Shipper, MD        SURGICAL HISTORY:  Past Surgical History:  Procedure Laterality Date  . NO PAST SURGERIES      REVIEW OF SYSTEMS:  A comprehensive review of systems was negative.   PHYSICAL EXAMINATION: General appearance: alert, cooperative and no distress Head: Normocephalic, without obvious abnormality, atraumatic Neck: no adenopathy, no JVD, supple, symmetrical, trachea midline and thyroid not enlarged, symmetric, no tenderness/mass/nodules Lymph nodes: Cervical, supraclavicular, and axillary nodes normal. Resp: clear to auscultation bilaterally Back: symmetric, no curvature. ROM normal. No CVA tenderness. Cardio: regular rate and rhythm, S1, S2 normal, no murmur, click, rub or gallop GI: soft, non-tender; bowel sounds normal; no masses,  no organomegaly Extremities: extremities normal, atraumatic, no cyanosis or edema  ECOG PERFORMANCE STATUS: 0 - Asymptomatic  Blood pressure (!) 129/94, pulse 92, temperature 98.5 F (36.9 C), temperature source Oral, resp. rate 18, height 5\' 7"  (1.702 m), weight 243 lb 1.6 oz (110.3 kg), SpO2 98 %.  LABORATORY DATA: Lab Results  Component Value Date   WBC 5.8 05/02/2017   HGB 15.3 03/21/2017   HCT 48.2 05/02/2017   MCV 95.3 05/02/2017   PLT 250 05/02/2017      Chemistry      Component Value Date/Time   NA 138 09/22/2016 0834   K 3.6 09/22/2016 0834   CL 100 09/22/2016 0834   CO2 29 09/22/2016 0834   BUN 13 09/22/2016 0834   CREATININE 1.23 09/22/2016 0834      Component Value Date/Time   CALCIUM 9.6 09/22/2016 0834   ALKPHOS 62 04/07/2016 0932   AST 23 04/07/2016 0932   ALT 11 (L) 04/07/2016 0932   BILITOT 2.4 (H) 04/07/2016 0932         RADIOGRAPHIC STUDIES: No results found.  ASSESSMENT AND PLAN: This is a very pleasant 47 years old white male with polycythemia likely reactive in nature secondary to his previous hormonal treatment. Jak 2 mutation panel was negative. CBC today showed normal hemoglobin and hematocrit. I recommended for the patient to continue on observation with repeat CBC in 3 months. He was advised to call immediately if he has any concerning symptoms in the interval. The patient voices understanding of current disease status and treatment options and is in agreement with the current care plan. All questions were answered. The patient knows to call the clinic with any problems, questions or concerns. We can certainly see the patient much sooner if necessary.  I spent 10 minutes counseling the patient face to face. The total time spent in the appointment was 15 minutes.  Disclaimer: This note was dictated with voice recognition software. Similar sounding words can inadvertently be transcribed and may not be corrected upon review.

## 2017-05-08 ENCOUNTER — Encounter: Payer: Self-pay | Admitting: Internal Medicine

## 2017-05-16 ENCOUNTER — Encounter: Payer: Self-pay | Admitting: Family Medicine

## 2017-05-17 ENCOUNTER — Other Ambulatory Visit: Payer: Self-pay | Admitting: Family Medicine

## 2017-05-17 MED ORDER — METOPROLOL SUCCINATE ER 50 MG PO TB24: 50.0000 mg | ORAL_TABLET | Freq: Every day | ORAL | 3 refills | Status: DC

## 2017-05-22 DIAGNOSIS — F4321 Adjustment disorder with depressed mood: Secondary | ICD-10-CM | POA: Diagnosis not present

## 2017-06-01 ENCOUNTER — Encounter: Payer: Self-pay | Admitting: Family Medicine

## 2017-06-01 ENCOUNTER — Ambulatory Visit: Payer: BLUE CROSS/BLUE SHIELD | Admitting: Family Medicine

## 2017-06-01 VITALS — BP 108/82 | HR 91 | Temp 98.5°F | Ht 67.0 in | Wt 247.5 lb

## 2017-06-01 DIAGNOSIS — I10 Essential (primary) hypertension: Secondary | ICD-10-CM

## 2017-06-01 MED ORDER — CHLORTHALIDONE 25 MG PO TABS: ORAL_TABLET | ORAL | 3 refills | Status: DC

## 2017-06-01 MED ORDER — METOPROLOL SUCCINATE ER 25 MG PO TB24: 25.0000 mg | ORAL_TABLET | Freq: Every day | ORAL | 3 refills | Status: DC

## 2017-06-01 NOTE — Progress Notes (Signed)
Chief Complaint  Patient presents with  . Hypertension    Subjective Kevin Newman is a 47 y.o. male who presents for hypertension follow up. He does monitor home blood pressures. Blood pressures ranging from 110's/80's on average. He is compliant with medications- chlorthalidone 25 mg/d, amlodipine 5 mg/d, metoprolol 25 mg/d. Patient has these side effects of medication: none He is usually adhering to a healthy diet overall. Current exercise: some walking   Past Medical History:  Diagnosis Date  . Anxiety   . Essential hypertension 04/22/2016  . Hypogonadism male 08/18/2016  . Sleep apnea    boderline, have CPAP    Review of Systems Cardiovascular: no chest pain Respiratory:  no shortness of breath  Exam BP 108/82 (BP Location: Left Arm, Patient Position: Sitting, Cuff Size: Large)   Pulse 91   Temp 98.5 F (36.9 C) (Oral)   Ht 5\' 7"  (1.702 m)   Wt 247 lb 8 oz (112.3 kg)   SpO2 97%   BMI 38.76 kg/m  General:  well developed, well nourished, in no apparent distress Skin: warm, no pallor or diaphoresis Eyes: pupils equal and round, sclera anicteric without injection Heart: RRR, no bruits, no LE edema Lungs: clear to auscultation, no accessory muscle use Psych: well oriented with normal range of affect and appropriate judgment/insight  Essential hypertension - Plan: chlorthalidone (HYGROTON) 25 MG tablet  Orders as above- continue Norvasc, chlorthalidone, metoprolol. Counseled on diet and exercise- cut down on sugar, OK to cut down on freq of home BP checks. F/u as originally scheduled. The patient voiced understanding and agreement to the plan.  [place], DO 06/01/17  4:26 PM

## 2017-06-01 NOTE — Patient Instructions (Signed)
Check blood pressure 2-3 times per month until your June visit.  Try to slowly wean down on sugar intake.  Aim to do some physical exertion for 150 minutes per week. This is typically divided into 5 days per week, 30 minutes per day. The activity should be enough to get your heart rate up. Anything is better than nothing if you have time constraints.  Let us know if you need anything.

## 2017-06-01 NOTE — Progress Notes (Signed)
Pre visit review using our clinic review tool, if applicable. No additional management support is needed unless otherwise documented below in the visit note. 

## 2017-06-07 ENCOUNTER — Encounter: Payer: Self-pay | Admitting: Internal Medicine

## 2017-06-07 ENCOUNTER — Ambulatory Visit: Payer: BLUE CROSS/BLUE SHIELD | Admitting: Internal Medicine

## 2017-06-07 VITALS — BP 114/80 | HR 60 | Ht 67.5 in | Wt 246.0 lb

## 2017-06-07 DIAGNOSIS — K649 Unspecified hemorrhoids: Secondary | ICD-10-CM | POA: Diagnosis not present

## 2017-06-07 DIAGNOSIS — E663 Overweight: Secondary | ICD-10-CM | POA: Diagnosis not present

## 2017-06-07 DIAGNOSIS — R197 Diarrhea, unspecified: Secondary | ICD-10-CM | POA: Diagnosis not present

## 2017-06-07 NOTE — Patient Instructions (Signed)
Please follow up as needed 

## 2017-06-07 NOTE — Progress Notes (Signed)
HISTORY OF PRESENT ILLNESS:  Kevin Newman is a 47 y.o. 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 See that dictation for details. Testing for celiac disease was negative. We recommended daily fiber supplementation and avoidance of sugar substitutes. He has complied. Subsequently complete colonoscopy was performed02/28/2019. Examination revealed incidental sigmoid diverticulosis and internal hemorrhoids. No other abnormalities. Random colon biopsies were obtained and returned normal. No evidence for microscopic colitis. He presents for follow-up at this time. Having made the adjustments as outlined above he reports that his condition is at least 60% better. He is only had diarrhea once in several weeks. Problems with urgency are significantly improved. No new problems. He does state that he has issues with his internal hemorrhoids for which she may be interested in having an office banding procedure performed as he finds hemorrhoidal creams inconvenient and ineffective. No new complaints though he does report some weight gain with the introduction of sugar into his diet. He will use Imodium on occasion and finds this effective  REVIEW OF SYSTEMS:  All non-GI ROS negative unless otherwise stated in the history of present illness except for itching and fatigue  Past Medical History:  Diagnosis Date  . Anxiety   . Essential hypertension 04/22/2016  . Hypogonadism male 08/18/2016  . Sleep apnea    boderline, have CPAP    Past Surgical History:  Procedure Laterality Date  . NO PAST SURGERIES      Social History Kevin Newman  reports that he has never smoked. He has never used smokeless tobacco. He reports that he drinks alcohol. He reports that he does not use drugs.  family history  includes Breast cancer in his paternal grandmother; Cancer in his maternal uncle; Gallbladder disease in his father and mother; Heart Problems in his father; Heart attack in his maternal grandfather; Heart disease (age of onset: 26) in his maternal grandmother; Prostate cancer in his paternal grandfather; Stroke in his maternal grandfather; Thyroid disease in his mother.  Allergies  Allergen Reactions  . Amoxil [Amoxicillin]   . Sulfa Antibiotics        PHYSICAL EXAMINATION: Vital signs: BP 114/80 (BP Location: Left Arm, Patient Position: Sitting, Cuff Size: Normal)   Pulse 60   Ht 5' 7.5" (1.715 m)   Wt 246 lb (111.6 kg)   BMI 37.96 kg/m   Constitutional: generally well-appearing, no acute distress Psychiatric: alert and oriented x3, cooperative Eyes: extraocular movements intact, anicteric, conjunctiva pink Mouth: oral pharynx moist, no lesions Neck: supple no lymphadenopathy Cardiovascular: heart regular rate and rhythm, no murmur Lungs: clear to auscultation bilaterally Abdomen: soft, nontender, nondistended, no obvious ascites, no peritoneal signs, normal bowel sounds, no organomegaly Rectal:omitted Extremities: no lower extremity edema bilaterally Skin: no lesions on visible extremities Neuro: No focal deficits. Cranial nerves intact  ASSESSMENT:  #1. Chronic intermittent postprandial urgency with loose stools. Improved with dietary fiber and avoidance of sugar substitutes. Suspect underlying irritable bowel syndrome. #2. Colonoscopy with diverticulosis #3. Symptomatic internal hemorrhoids #4. Overweight   PLAN:  #1. Continue fiber and avoidance of sugar substitutes. #2. I mentioned to him that magnesium supplementation, which he is on, can definitely cause diarrhea. Recommended holding for a week or 2 #3. Discussed in office banding procedure for symptomatic hemorrhoids. He has a brochure to review. He will check with his insurance company to see if  this is  financially doable. If so, he will contact the office I will refer him to one of the partners who performs in office banding #4. Recommended exercise such as stationary bike to help with concerns over weight gain #5. Routine screening colonoscopy 10 years #6.Okay to use Imodium as needed.  #7.Routine GI office follow-upone year. Sooner if needed

## 2017-06-12 ENCOUNTER — Encounter: Payer: Self-pay | Admitting: Internal Medicine

## 2017-06-15 DIAGNOSIS — F4321 Adjustment disorder with depressed mood: Secondary | ICD-10-CM | POA: Diagnosis not present

## 2017-07-06 DIAGNOSIS — F4321 Adjustment disorder with depressed mood: Secondary | ICD-10-CM | POA: Diagnosis not present

## 2017-07-25 DIAGNOSIS — F4321 Adjustment disorder with depressed mood: Secondary | ICD-10-CM | POA: Diagnosis not present

## 2017-08-01 ENCOUNTER — Encounter: Payer: Self-pay | Admitting: Internal Medicine

## 2017-08-01 ENCOUNTER — Telehealth: Payer: Self-pay | Admitting: Internal Medicine

## 2017-08-01 ENCOUNTER — Inpatient Hospital Stay (HOSPITAL_BASED_OUTPATIENT_CLINIC_OR_DEPARTMENT_OTHER): Payer: BLUE CROSS/BLUE SHIELD | Admitting: Internal Medicine

## 2017-08-01 ENCOUNTER — Inpatient Hospital Stay: Payer: BLUE CROSS/BLUE SHIELD | Attending: Internal Medicine

## 2017-08-01 VITALS — BP 132/94 | HR 85 | Temp 98.0°F | Resp 18 | Ht 67.5 in | Wt 259.7 lb

## 2017-08-01 DIAGNOSIS — D751 Secondary polycythemia: Secondary | ICD-10-CM | POA: Diagnosis not present

## 2017-08-01 DIAGNOSIS — F419 Anxiety disorder, unspecified: Secondary | ICD-10-CM | POA: Insufficient documentation

## 2017-08-01 DIAGNOSIS — G4730 Sleep apnea, unspecified: Secondary | ICD-10-CM | POA: Insufficient documentation

## 2017-08-01 DIAGNOSIS — E291 Testicular hypofunction: Secondary | ICD-10-CM | POA: Diagnosis not present

## 2017-08-01 DIAGNOSIS — I10 Essential (primary) hypertension: Secondary | ICD-10-CM | POA: Insufficient documentation

## 2017-08-01 DIAGNOSIS — Z79899 Other long term (current) drug therapy: Secondary | ICD-10-CM

## 2017-08-01 LAB — CBC WITH DIFFERENTIAL (CANCER CENTER ONLY)
BASOS ABS: 0 10*3/uL (ref 0.0–0.1)
BASOS PCT: 1 %
EOS ABS: 0.1 10*3/uL (ref 0.0–0.5)
Eosinophils Relative: 1 %
HEMATOCRIT: 49.9 % (ref 38.4–49.9)
HEMOGLOBIN: 17 g/dL (ref 13.0–17.1)
Lymphocytes Relative: 32 %
Lymphs Abs: 2.2 10*3/uL (ref 0.9–3.3)
MCH: 31.8 pg (ref 27.2–33.4)
MCHC: 34 g/dL (ref 32.0–36.0)
MCV: 93.4 fL (ref 79.3–98.0)
MONOS PCT: 7 %
Monocytes Absolute: 0.5 10*3/uL (ref 0.1–0.9)
NEUTROS ABS: 4.1 10*3/uL (ref 1.5–6.5)
NEUTROS PCT: 59 %
Platelet Count: 237 10*3/uL (ref 140–400)
RBC: 5.34 MIL/uL (ref 4.20–5.82)
RDW: 13.6 % (ref 11.0–14.6)
WBC: 6.8 10*3/uL (ref 4.0–10.3)

## 2017-08-01 NOTE — Progress Notes (Signed)
[facility] [phone]   [phone]) [phone]  OFFICE PROGRESS NOTE  [name], DO [address] [address]  DIAGNOSIS: Reactive polycythemia.  Jak 2 mutation panel was negative.  CURRENT THERAPY: Phlebotomy on as-needed basis.  Last one was performed on March 14, 2017.  INTERVAL HISTORY: Kevin Newman 47 y.o. male returns to the clinic today for follow-up visit.  The patient is feeling fine today with no concerning complaints.  He denied having any chest pain, shortness of breath, cough or hemoptysis.  He denied having any fever or chills.  He has no nausea, vomiting, diarrhea or constipation.  He has no recent weight loss or night sweats.  He is here today for evaluation and repeat CBC.  MEDICAL HISTORY: Past Medical History:  Diagnosis Date  . Anxiety   . Essential hypertension 04/22/2016  . Hypogonadism male 08/18/2016  . Sleep apnea    boderline, have CPAP    ALLERGIES:  is allergic to amoxil [amoxicillin] and sulfa antibiotics.  MEDICATIONS:  Current Outpatient Medications  Medication Sig Dispense Refill  . amLODipine (NORVASC) 5 MG tablet TAKE 1 TABLET(5 MG) BY MOUTH DAILY 90 tablet 3  . chlorthalidone (HYGROTON) 25 MG tablet TAKE 1 TABLET(25 MG) BY MOUTH DAILY 90 tablet 3  . Cholecalciferol (VITAMIN D-3 PO) Take by mouth.    . Coenzyme Q10 (COQ10) 100 MG CAPS Take 100 mg by mouth daily.    Marland Kitchen Dexchlorphen-PSE-Methscop (D-HIST D PO) Take by mouth.    Marland Kitchen DHEA 25 MG CAPS Take by mouth.    . Garlic 970 MG TABS Take 500 mg by mouth daily.    . Magnesium 400 MG TABS Take 400 mg by mouth 2 (two) times daily.    . metoprolol succinate (TOPROL-XL) 25 MG 24 hr tablet Take 1 tablet (25 mg total) by mouth daily. 90 tablet 3  . Multiple Vitamins-Minerals (MULTIVITAMIN ADULT PO) Take by mouth.    Marland Kitchen OVER THE COUNTER MEDICATION BIOTIC DETOXIFICATION SUPPORT.  Take 1 tablet daily    . Taurine 1000 MG CAPS Take  2,000 mg by mouth 3 (three) times daily.    . vitamin E 1000 UNIT capsule Take 1,000 Units by mouth daily.     No current facility-administered medications for this visit.     SURGICAL HISTORY:  Past Surgical History:  Procedure Laterality Date  . NO PAST SURGERIES      REVIEW OF SYSTEMS:  A comprehensive review of systems was negative.   PHYSICAL EXAMINATION: General appearance: alert, cooperative and no distress Head: Normocephalic, without obvious abnormality, atraumatic Neck: no adenopathy, no JVD, supple, symmetrical, trachea midline and thyroid not enlarged, symmetric, no tenderness/mass/nodules Lymph nodes: Cervical, supraclavicular, and axillary nodes normal. Resp: clear to auscultation bilaterally Back: symmetric, no curvature. ROM normal. No CVA tenderness. Cardio: regular rate and rhythm, S1, S2 normal, no murmur, click, rub or gallop GI: soft, non-tender; bowel sounds normal; no masses,  no organomegaly Extremities: extremities normal, atraumatic, no cyanosis or edema  ECOG PERFORMANCE STATUS: 0 - Asymptomatic  Blood pressure (!) 132/94, pulse 85, temperature 98 F (36.7 C), temperature source Oral, resp. rate 18, height 5' 7.5" (1.715 m), weight 259 lb 11.2 oz (117.8 kg), SpO2 97 %.  LABORATORY DATA: Lab Results  Component Value Date   WBC 6.8 08/01/2017   HGB 17.0 08/01/2017   HCT 49.9 08/01/2017   MCV 93.4 08/01/2017   PLT 237 08/01/2017  Chemistry      Component Value Date/Time   NA 138 09/22/2016 0834   K 3.6 09/22/2016 0834   CL 100 09/22/2016 0834   CO2 29 09/22/2016 0834   BUN 13 09/22/2016 0834   CREATININE 1.23 09/22/2016 0834      Component Value Date/Time   CALCIUM 9.6 09/22/2016 0834   ALKPHOS 62 04/07/2016 0932   AST 23 04/07/2016 0932   ALT 11 (L) 04/07/2016 0932   BILITOT 2.4 (H) 04/07/2016 0932       RADIOGRAPHIC STUDIES: No results found.  ASSESSMENT AND PLAN: This is a very pleasant 47 years old white male with  polycythemia likely reactive in nature secondary to his previous hormonal treatment. Jak 2 mutation panel was negative. The patient is current on observation and he is feeling fine.  Repeat CBC today showed high normal hemoglobin and hematocrit.  I gave the patient the option of continuous observation and monitoring with repeat blood work in 3 months versus proceeding with phlebotomy today. He would consider donating blood letter this week at his work.  I will see him back for follow-up visit in 3 months with repeat CBC. He was advised to call immediately if he has any concerning symptoms in the interval. The patient voices understanding of current disease status and treatment options and is in agreement with the current care plan. All questions were answered. The patient knows to call the clinic with any problems, questions or concerns. We can certainly see the patient much sooner if necessary. I spent 10 minutes counseling the patient face to face. The total time spent in the appointment was 15 minutes.  Disclaimer: This note was dictated with voice recognition software. Similar sounding words can inadvertently be transcribed and may not be corrected upon review.

## 2017-08-01 NOTE — Telephone Encounter (Signed)
Gave pt avs and calendar with appts per 6/4 los.  °

## 2017-08-04 ENCOUNTER — Encounter: Payer: Self-pay | Admitting: *Deleted

## 2017-08-11 DIAGNOSIS — F4321 Adjustment disorder with depressed mood: Secondary | ICD-10-CM | POA: Diagnosis not present

## 2017-08-15 ENCOUNTER — Ambulatory Visit: Payer: BLUE CROSS/BLUE SHIELD | Admitting: Internal Medicine

## 2017-08-15 ENCOUNTER — Encounter: Payer: Self-pay | Admitting: Internal Medicine

## 2017-08-15 VITALS — BP 120/88 | HR 89 | Ht 68.0 in | Wt 258.0 lb

## 2017-08-15 DIAGNOSIS — L290 Pruritus ani: Secondary | ICD-10-CM | POA: Diagnosis not present

## 2017-08-15 DIAGNOSIS — K648 Other hemorrhoids: Secondary | ICD-10-CM

## 2017-08-15 NOTE — Patient Instructions (Addendum)
You have been scheduled for your 2nd banding on 10/17/17 at 330 pm   [place]   1. The procedure you have had should have been relatively painless since the banding of the area involved does not have nerve endings and there is no pain sensation.  The rubber band cuts off the blood supply to the hemorrhoid and the band may fall off as soon as 48 hours after the banding (the band may occasionally be seen in the toilet bowl following a bowel movement). You may notice a temporary feeling of fullness in the rectum which should respond adequately to plain Tylenol or Motrin.  2. Following the banding, avoid strenuous exercise that evening and resume full activity the next day.  A sitz bath (soaking in a warm tub) or bidet is soothing, and can be useful for cleansing the area after bowel movements.     3. To avoid constipation, take two tablespoons of natural wheat bran, natural oat bran, flax, Benefiber or any over the counter fiber supplement and increase your water intake to 7-8 glasses daily.    4. Unless you have been prescribed anorectal medication, do not put anything inside your rectum for two weeks: No suppositories, enemas, fingers, etc.  5. Occasionally, you may have more bleeding than usual after the banding procedure.  This is often from the untreated hemorrhoids rather than the treated one.  Don't be concerned if there is a tablespoon or so of blood.  If there is more blood than this, lie flat with your bottom higher than your head and apply an ice pack to the area. If the bleeding does not stop within a half an hour or if you feel faint, call our office at [phone] or go to the emergency room.  6. Problems are not common; however, if there is a substantial amount of bleeding, severe pain, chills, fever or difficulty passing urine (very rare) or other problems, you should call us at [phone] or report to the nearest emergency  room.  7. Do not stay seated continuously for more than 2-3 hours for a day or two after the procedure.  Tighten your buttock muscles 10-15 times every two hours and take 10-15 deep breaths every 1-2 hours.  Do not spend more than a few minutes on the toilet if you cannot empty your bowel; instead re-visit the toilet at a later time.

## 2017-08-15 NOTE — Progress Notes (Signed)
Kevin Newman is a 47 year old male with a history of IBS-D who was seen by Dr. Henrene Pastor who presents for consideration of hemorrhoidal banding for symptomatic internal hemorrhoids. He has had hemorrhoidal symptoms which have been annoying him for about 3 years. He had a colonoscopy which Dr. Henrene Pastor performed on 04/27/2017.  This was normal except for sigmoid diverticulosis and internal hemorrhoids. His hemorrhoidal symptoms predominantly is perianal itching.  Rare bleeding with wiping and after bowel movement.  He is tried Preparation H cream and suppository.  Most recently suppositories have helped more.  He does not think he is using a steroid cream.   PROCEDURE NOTE:  The patient presents with symptomatic grade 2 internal hemorrhoids, requesting rubber band ligation of his hemorrhoidal disease.  All risks, benefits and alternative forms of therapy were described and informed consent was obtained.   The anorectum was pre-medicated with 0.125% nitroglycerin ointment The decision was made to band the LL internal hemorrhoid, and the [place] was used to perform band ligation without complication.   Digital anorectal examination was then performed to assure proper positioning of the band, and to adjust the banded tissue as required.  The patient was discharged home without pain or other issues.  Dietary and behavioral recommendations were given and along with follow-up instructions.     The following adjunctive treatments were recommended: Begin creamy Desitin as a barrier protectant to the perianal skin after bathing and bowel movement Discontinue other topical perianal creams at this time  The patient will return as scheduled for follow-up and possible additional banding as required. No complications were encountered and the patient tolerated the procedure well.

## 2017-08-18 ENCOUNTER — Encounter: Payer: BLUE CROSS/BLUE SHIELD | Admitting: Family Medicine

## 2017-09-05 DIAGNOSIS — F4321 Adjustment disorder with depressed mood: Secondary | ICD-10-CM | POA: Diagnosis not present

## 2017-09-28 ENCOUNTER — Encounter: Payer: Self-pay | Admitting: Family Medicine

## 2017-09-28 ENCOUNTER — Ambulatory Visit (INDEPENDENT_AMBULATORY_CARE_PROVIDER_SITE_OTHER): Payer: BLUE CROSS/BLUE SHIELD | Admitting: Family Medicine

## 2017-09-28 VITALS — BP 122/88 | HR 80 | Temp 98.5°F | Ht 68.0 in | Wt 259.2 lb

## 2017-09-28 DIAGNOSIS — Z0000 Encounter for general adult medical examination without abnormal findings: Secondary | ICD-10-CM

## 2017-09-28 LAB — COMPREHENSIVE METABOLIC PANEL
ALBUMIN: 4.5 g/dL (ref 3.5–5.2)
ALK PHOS: 59 U/L (ref 39–117)
ALT: 13 U/L (ref 0–53)
AST: 24 U/L (ref 0–37)
BILIRUBIN TOTAL: 2.3 mg/dL — AB (ref 0.2–1.2)
BUN: 18 mg/dL (ref 6–23)
CO2: 32 mEq/L (ref 19–32)
Calcium: 10 mg/dL (ref 8.4–10.5)
Chloride: 98 mEq/L (ref 96–112)
Creatinine, Ser: 1.09 mg/dL (ref 0.40–1.50)
GFR: 77.66 mL/min (ref >60.00)
GLUCOSE: 104 mg/dL — AB (ref 70–99)
POTASSIUM: 3.4 meq/L — AB (ref 3.5–5.1)
SODIUM: 138 meq/L (ref 135–145)
TOTAL PROTEIN: 7.2 g/dL (ref 6.0–8.3)

## 2017-09-28 LAB — LIPID PANEL
CHOLESTEROL: 164 mg/dL (ref 0–200)
HDL: 48.7 mg/dL (ref >39.00)
LDL Cholesterol: 86 mg/dL (ref 0–99)
NonHDL: 115.54
Total CHOL/HDL Ratio: 3
Triglycerides: 149 mg/dL (ref 0.0–149.0)
VLDL: 29.8 mg/dL (ref 0.0–40.0)

## 2017-09-28 NOTE — Progress Notes (Signed)
Pre visit review using our clinic review tool, if applicable. No additional management support is needed unless otherwise documented below in the visit note. 

## 2017-09-28 NOTE — Patient Instructions (Signed)
Get back on the wagon with your diet.  Aim to do some physical exertion for 150 minutes per week. This is typically divided into 5 days per week, 30 minutes per day. The activity should be enough to get your heart rate up. Anything is better than nothing if you have time constraints.  Give [place] 2-3 business days to get the results of your labs back.  Let us know if you need anything.

## 2017-09-28 NOTE — Progress Notes (Signed)
Chief Complaint  Patient presents with  . Annual Exam    Well Male Kevin Newman is here for a complete physical.   His last physical was >1 year ago.  Current diet: in general, an "unhealthy" diet.  Has been busy over past few mo Current exercise: none recently Weight trend: gaining Seat belt? Yes.    Health maintenance Tetanus- Yes HIV- Yes  Past Medical History:  Diagnosis Date  . Anxiety   . Diverticulosis   . Essential hypertension 04/22/2016  . Hypogonadism male 08/18/2016  . Internal hemorrhoids   . Sleep apnea    boderline, have CPAP    Past Surgical History:  Procedure Laterality Date  . NO PAST SURGERIES      Medications  Current Outpatient Medications on File Prior to Visit  Medication Sig Dispense Refill  . amLODipine (NORVASC) 5 MG tablet TAKE 1 TABLET(5 MG) BY MOUTH DAILY 90 tablet 3  . chlorthalidone (HYGROTON) 25 MG tablet TAKE 1 TABLET(25 MG) BY MOUTH DAILY 90 tablet 3  . Cholecalciferol (VITAMIN D-3 PO) Take by mouth.    . Coenzyme Q10 (COQ10) 100 MG CAPS Take 100 mg by mouth daily.    Marland Kitchen Dexchlorphen-PSE-Methscop (D-HIST D PO) Take by mouth.    Marland Kitchen DHEA 25 MG CAPS Take by mouth.    . Garlic 875 MG TABS Take 500 mg by mouth daily.    . Magnesium 400 MG TABS Take 400 mg by mouth 2 (two) times daily.    . metoprolol succinate (TOPROL-XL) 25 MG 24 hr tablet Take 1 tablet (25 mg total) by mouth daily. 90 tablet 3  . Multiple Vitamins-Minerals (MULTIVITAMIN ADULT PO) Take by mouth.    Marland Kitchen OVER THE COUNTER MEDICATION BIOTIC DETOXIFICATION SUPPORT.  Take 1 tablet daily    . Taurine 1000 MG CAPS Take 2,000 mg by mouth 3 (three) times daily.    . vitamin E 1000 UNIT capsule Take 1,000 Units by mouth daily.     Allergies Allergies  Allergen Reactions  . Amoxil [Amoxicillin]   . Sulfa Antibiotics     Family History Family History  Problem Relation Age of Onset  . Heart disease Maternal Grandmother 80  . Thyroid disease Mother   . Gallbladder disease  Mother   . Gallbladder disease Father   . Heart Problems Father   . Heart attack Maternal Grandfather   . Stroke Maternal Grandfather   . Breast cancer Paternal Grandmother   . Prostate cancer Paternal Grandfather   . Cancer Maternal Uncle        type unknown    Review of Systems: Constitutional: no fevers or chills Eye:  no recent significant change in vision Ear/Nose/Mouth/Throat:  Ears:  no tinnitus or hearing loss Nose/Mouth/Throat:  no complaints of nasal congestion, no sore throat Cardiovascular:  no chest pain, no palpitations Respiratory:  no cough and no shortness of breath Gastrointestinal:  no abdominal pain, no change in bowel habits GU:  Male: negative for dysuria, frequency, and incontinence and negative for prostate symptoms Musculoskeletal/Extremities:  no pain, redness, or swelling of the joints Integumentary (Skin/Breast):  no abnormal skin lesions reported Neurologic:  no headaches, no numbness, tingling Endocrine: No unexpected weight changes Hematologic/Lymphatic:  no night sweats  Exam BP 122/88 (BP Location: Left Arm, Patient Position: Sitting, Cuff Size: Large)   Pulse 80   Temp 98.5 F (36.9 C) (Oral)   Ht 5\' 8"  (1.727 m)   Wt 259 lb 4 oz (117.6 kg)   SpO2 95%  BMI 39.42 kg/m  General:  well developed, well nourished, in no apparent distress Skin:  no significant moles, warts, or growths Head:  no masses, lesions, or tenderness Eyes:  pupils equal and round, sclera anicteric without injection Ears:  canals without lesions, TMs shiny without retraction, no obvious effusion, no erythema Nose:  nares patent, septum midline, mucosa normal Throat/Pharynx:  lips and gingiva without lesion; tongue and uvula midline; non-inflamed pharynx; no exudates or postnasal drainage Neck: neck supple without adenopathy, thyromegaly, or masses Lungs:  clear to auscultation, breath sounds equal bilaterally, no respiratory distress Cardio:  regular rate and rhythm, no  bruits, 1+ pitting b/l LE edema Abdomen:  abdomen soft, nontender; bowel sounds normal; no masses or organomegaly+ Genital (male): circumcised penis, no lesions or discharge; testes present bilaterally without masses or tenderness Rectal: Deferred Musculoskeletal:  symmetrical muscle groups noted without atrophy or deformity Extremities:  no clubbing, cyanosis, or edema, no deformities, no skin discoloration Neuro:  gait normal; deep tendon reflexes normal and symmetric Psych: well oriented with normal range of affect and appropriate judgment/insight  Assessment and Plan  Well adult exam - Plan: Lipid panel, Comprehensive metabolic panel   Well 47 y.o. male. Counseled on diet and exercise. Other orders as above. Follow up in 6 mo pending the above workup. The patient voiced understanding and agreement to the plan.  [place], DO 09/28/17 9:29 AM

## 2017-09-29 ENCOUNTER — Other Ambulatory Visit: Payer: Self-pay | Admitting: Family Medicine

## 2017-09-29 DIAGNOSIS — R739 Hyperglycemia, unspecified: Secondary | ICD-10-CM

## 2017-09-29 DIAGNOSIS — E876 Hypokalemia: Secondary | ICD-10-CM

## 2017-10-04 ENCOUNTER — Other Ambulatory Visit (INDEPENDENT_AMBULATORY_CARE_PROVIDER_SITE_OTHER): Payer: BLUE CROSS/BLUE SHIELD

## 2017-10-04 DIAGNOSIS — R739 Hyperglycemia, unspecified: Secondary | ICD-10-CM

## 2017-10-04 DIAGNOSIS — E876 Hypokalemia: Secondary | ICD-10-CM

## 2017-10-04 LAB — BASIC METABOLIC PANEL
BUN: 18 mg/dL (ref 6–23)
CHLORIDE: 99 meq/L (ref 96–112)
CO2: 34 mEq/L — ABNORMAL HIGH (ref 19–32)
Calcium: 9.9 mg/dL (ref 8.4–10.5)
Creatinine, Ser: 1.11 mg/dL (ref 0.40–1.50)
GFR: 76.05 mL/min (ref >60.00)
Glucose, Bld: 93 mg/dL (ref 70–99)
POTASSIUM: 3.8 meq/L (ref 3.5–5.1)
Sodium: 140 mEq/L (ref 135–145)

## 2017-10-04 LAB — HEMOGLOBIN A1C: HEMOGLOBIN A1C: 5.1 % (ref 4.6–6.5)

## 2017-10-17 ENCOUNTER — Encounter

## 2017-10-17 ENCOUNTER — Ambulatory Visit: Payer: BLUE CROSS/BLUE SHIELD | Admitting: Internal Medicine

## 2017-10-17 ENCOUNTER — Encounter: Payer: Self-pay | Admitting: Internal Medicine

## 2017-10-17 VITALS — BP 124/74 | HR 95 | Ht 68.0 in | Wt 262.4 lb

## 2017-10-17 DIAGNOSIS — L290 Pruritus ani: Secondary | ICD-10-CM | POA: Diagnosis not present

## 2017-10-17 DIAGNOSIS — K648 Other hemorrhoids: Secondary | ICD-10-CM

## 2017-10-17 DIAGNOSIS — K641 Second degree hemorrhoids: Secondary | ICD-10-CM | POA: Diagnosis not present

## 2017-10-17 NOTE — Patient Instructions (Addendum)
Your 3rd banding is scheduled for 12/06/17 at 345 pm.  HEMORRHOID BANDING PROCEDURE    FOLLOW-UP CARE   1. The procedure you have had should have been relatively painless since the banding of the area involved does not have nerve endings and there is no pain sensation.  The rubber band cuts off the blood supply to the hemorrhoid and the band may fall off as soon as 48 hours after the banding (the band may occasionally be seen in the toilet bowl following a bowel movement). You may notice a temporary feeling of fullness in the rectum which should respond adequately to plain Tylenol or Motrin.  2. Following the banding, avoid strenuous exercise that evening and resume full activity the next day.  A sitz bath (soaking in a warm tub) or bidet is soothing, and can be useful for cleansing the area after bowel movements.     3. To avoid constipation, take two tablespoons of natural wheat bran, natural oat bran, flax, Benefiber or any over the counter fiber supplement and increase your water intake to 7-8 glasses daily.    4. Unless you have been prescribed anorectal medication, do not put anything inside your rectum for two weeks: No suppositories, enemas, fingers, etc.  5. Occasionally, you may have more bleeding than usual after the banding procedure.  This is often from the untreated hemorrhoids rather than the treated one.  Don't be concerned if there is a tablespoon or so of blood.  If there is more blood than this, lie flat with your bottom higher than your head and apply an ice pack to the area. If the bleeding does not stop within a half an hour or if you feel faint, call our office at [phone] or go to the emergency room.  6. Problems are not common; however, if there is a substantial amount of bleeding, severe pain, chills, fever or difficulty passing urine (very rare) or other problems, you should call us at [phone] or report to the nearest emergency room.  7. Do not stay  seated continuously for more than 2-3 hours for a day or two after the procedure.  Tighten your buttock muscles 10-15 times every two hours and take 10-15 deep breaths every 1-2 hours.  Do not spend more than a few minutes on the toilet if you cannot empty your bowel; instead re-visit the toilet at a later time.  If you are age 35 or older, your body mass index should be between 23-30. Your Body mass index is 39.9 kg/m. If this is out of the aforementioned range listed, please consider follow up with your Primary Care Provider.  If you are age 45 or younger, your body mass index should be between 19-25. Your Body mass index is 39.9 kg/m. If this is out of the aformentioned range listed, please consider follow up with your Primary Care Provider.

## 2017-10-17 NOTE — Progress Notes (Signed)
Kevin Newman is a 47 year old male patient with IBS D who returns for hemorrhoidal banding.  History of symptomatic internal hemorrhoids.  Symptoms prior to banding were predominantly perianal itching but also rare bleeding with wiping after bowel movement. Still some perianal itching.  Tolerated first banding well Using Preparation H suppository 2-3 times a day Desitin did not seem to help much   PROCEDURE NOTE:  The patient presents with symptomatic grade 2 internal hemorrhoids, requesting rubber band ligation of his hemorrhoidal disease.  All risks, benefits and alternative forms of therapy were described and informed consent was obtained.   The anorectum was pre-medicated with 0.125% nitroglycerin ointment.  Rectal exam there is some evidence of perianal skin atrophy without overt fungal infection The decision was made to band the right anterior (left lateral banded 08/15/2017) internal hemorrhoid, and the [place] was used to perform band ligation without complication.   Digital anorectal examination was then performed to assure proper positioning of the band, and to adjust the banded tissue as required.  The patient was discharged home without pain or other issues.  Dietary and behavioral recommendations were given and along with follow-up instructions.     The following adjunctive treatments were recommended: Decrease and even stop hydrocortisone suppository as this can predispose to perianal skin atrophy with chronic steroid I recommended RectiCare for perianal itching as needed  The patient will return as scheduled for  follow-up and possible additional banding as required. No complications were encountered and the patient tolerated the procedure well.

## 2017-10-31 DIAGNOSIS — F4321 Adjustment disorder with depressed mood: Secondary | ICD-10-CM | POA: Diagnosis not present

## 2017-11-06 ENCOUNTER — Inpatient Hospital Stay: Payer: BLUE CROSS/BLUE SHIELD | Attending: Internal Medicine | Admitting: Internal Medicine

## 2017-11-06 ENCOUNTER — Encounter: Payer: Self-pay | Admitting: Internal Medicine

## 2017-11-06 ENCOUNTER — Inpatient Hospital Stay: Payer: BLUE CROSS/BLUE SHIELD

## 2017-11-06 DIAGNOSIS — Z8719 Personal history of other diseases of the digestive system: Secondary | ICD-10-CM | POA: Insufficient documentation

## 2017-11-06 DIAGNOSIS — I10 Essential (primary) hypertension: Secondary | ICD-10-CM

## 2017-11-06 DIAGNOSIS — D751 Secondary polycythemia: Secondary | ICD-10-CM | POA: Insufficient documentation

## 2017-11-06 DIAGNOSIS — Z79899 Other long term (current) drug therapy: Secondary | ICD-10-CM | POA: Diagnosis not present

## 2017-11-06 DIAGNOSIS — G4730 Sleep apnea, unspecified: Secondary | ICD-10-CM | POA: Insufficient documentation

## 2017-11-06 DIAGNOSIS — F419 Anxiety disorder, unspecified: Secondary | ICD-10-CM

## 2017-11-06 LAB — CBC WITH DIFFERENTIAL (CANCER CENTER ONLY)
BASOS ABS: 0 10*3/uL (ref 0.0–0.1)
Basophils Relative: 1 %
EOS ABS: 0.1 10*3/uL (ref 0.0–0.5)
EOS PCT: 1 %
HCT: 48.2 % (ref 38.4–49.9)
Hemoglobin: 16.9 g/dL (ref 13.0–17.1)
LYMPHS PCT: 31 %
Lymphs Abs: 2.3 10*3/uL (ref 0.9–3.3)
MCH: 32.8 pg (ref 27.2–33.4)
MCHC: 35.1 g/dL (ref 32.0–36.0)
MCV: 93.6 fL (ref 79.3–98.0)
Monocytes Absolute: 0.8 10*3/uL (ref 0.1–0.9)
Monocytes Relative: 11 %
Neutro Abs: 4.1 10*3/uL (ref 1.5–6.5)
Neutrophils Relative %: 56 %
PLATELETS: 236 10*3/uL (ref 140–400)
RBC: 5.15 MIL/uL (ref 4.20–5.82)
RDW: 12.4 % (ref 11.0–14.6)
WBC: 7.2 10*3/uL (ref 4.0–10.3)

## 2017-11-06 NOTE — Progress Notes (Signed)
[place] [phone]   [phone]  OFFICE PROGRESS NOTE  [name], DO [address] [address]  DIAGNOSIS: Reactive polycythemia.  Jak 2 mutation panel was negative.  CURRENT THERAPY: Phlebotomy on as-needed basis.  Last one was performed on March 14, 2017.  INTERVAL HISTORY: Kevin Newman 47 y.o. male returns to the clinic today for follow-up visit accompanied by his wife.  The patient is feeling fine today with no concerning complaints.  He denied having any fatigue or weakness.  He denied having any chest pain, shortness of breath, cough or hemoptysis.  He has no recent weight loss or night sweats.  He has no nausea, vomiting, diarrhea or constipation.  The patient is here today for evaluation with repeat CBC.  MEDICAL HISTORY: Past Medical History:  Diagnosis Date  . Anxiety   . Diverticulosis   . Essential hypertension 04/22/2016  . Hypogonadism male 08/18/2016  . Internal hemorrhoids   . Sleep apnea    boderline, have CPAP    ALLERGIES:  is allergic to amoxil [amoxicillin] and sulfa antibiotics.  MEDICATIONS:  Current Outpatient Medications  Medication Sig Dispense Refill  . amLODipine (NORVASC) 5 MG tablet TAKE 1 TABLET(5 MG) BY MOUTH DAILY 90 tablet 3  . chlorthalidone (HYGROTON) 25 MG tablet TAKE 1 TABLET(25 MG) BY MOUTH DAILY 90 tablet 3  . Cholecalciferol (VITAMIN D-3 PO) Take by mouth.    . Coenzyme Q10 (COQ10) 100 MG CAPS Take 100 mg by mouth daily.    Marland Kitchen Dexchlorphen-PSE-Methscop (D-HIST D PO) Take by mouth.    Marland Kitchen DHEA 25 MG CAPS Take by mouth.    . Garlic 211 MG TABS Take 500 mg by mouth daily.    Marland Kitchen loratadine (CLARITIN) 10 MG tablet     . Magnesium 400 MG TABS Take 400 mg by mouth 2 (two) times daily.    . metoprolol succinate (TOPROL-XL) 25 MG 24 hr tablet Take 1 tablet (25 mg total) by mouth daily. 90 tablet 3  . Multiple Vitamins-Minerals (MULTIVITAMIN ADULT PO) Take by  mouth.    Marland Kitchen OVER THE COUNTER MEDICATION BIOTIC DETOXIFICATION SUPPORT.  Take 1 tablet daily    . Taurine 1000 MG CAPS Take 2,000 mg by mouth 3 (three) times daily.    . vitamin E 1000 UNIT capsule Take 1,000 Units by mouth daily.     No current facility-administered medications for this visit.     SURGICAL HISTORY:  Past Surgical History:  Procedure Laterality Date  . NO PAST SURGERIES      REVIEW OF SYSTEMS:  A comprehensive review of systems was negative.   PHYSICAL EXAMINATION: General appearance: alert, cooperative and no distress Head: Normocephalic, without obvious abnormality, atraumatic Neck: no adenopathy, no JVD, supple, symmetrical, trachea midline and thyroid not enlarged, symmetric, no tenderness/mass/nodules Lymph nodes: Cervical, supraclavicular, and axillary nodes normal. Resp: clear to auscultation bilaterally Back: symmetric, no curvature. ROM normal. No CVA tenderness. Cardio: regular rate and rhythm, S1, S2 normal, no murmur, click, rub or gallop GI: soft, non-tender; bowel sounds normal; no masses,  no organomegaly Extremities: extremities normal, atraumatic, no cyanosis or edema  ECOG PERFORMANCE STATUS: 0 - Asymptomatic  Blood pressure 124/78, pulse 87, temperature 98.5 F (36.9 C), temperature source Oral, resp. rate 18, height 5\' 8"  (1.727 m), weight 262 lb 1.6 oz (118.9 kg), SpO2 97 %.  LABORATORY DATA: Lab Results  Component Value Date   WBC 7.2 11/06/2017  HGB 16.9 11/06/2017   HCT 48.2 11/06/2017   MCV 93.6 11/06/2017   PLT 236 11/06/2017      Chemistry      Component Value Date/Time   NA 140 10/04/2017 0911   K 3.8 10/04/2017 0911   CL 99 10/04/2017 0911   CO2 34 (H) 10/04/2017 0911   BUN 18 10/04/2017 0911   CREATININE 1.11 10/04/2017 0911      Component Value Date/Time   CALCIUM 9.9 10/04/2017 0911   ALKPHOS 59 09/28/2017 0932   AST 24 09/28/2017 0932   ALT 13 09/28/2017 0932   BILITOT 2.3 (H) 09/28/2017 0932        RADIOGRAPHIC STUDIES: No results found.  ASSESSMENT AND PLAN: This is a very pleasant 47 years old white male with polycythemia likely reactive in nature secondary to his previous hormonal treatment. Jak 2 mutation panel was negative. The patient is feeling fine today with no concerning complaints.  Repeat CBC showed no abnormalities. I recommended for the patient to continue on observation with follow-up visit by his primary care physician. I will see him on as-needed basis at this point. He was advised to call immediately if he has any concerning symptoms. The patient voices understanding of current disease status and treatment options and is in agreement with the current care plan. All questions were answered. The patient knows to call the clinic with any problems, questions or concerns. We can certainly see the patient much sooner if necessary. I spent 10 minutes counseling the patient face to face. The total time spent in the appointment was 15 minutes.  Disclaimer: This note was dictated with voice recognition software. Similar sounding words can inadvertently be transcribed and may not be corrected upon review.

## 2017-11-14 DIAGNOSIS — F4321 Adjustment disorder with depressed mood: Secondary | ICD-10-CM | POA: Diagnosis not present

## 2017-11-21 DIAGNOSIS — F4321 Adjustment disorder with depressed mood: Secondary | ICD-10-CM | POA: Diagnosis not present

## 2017-11-28 DIAGNOSIS — F4321 Adjustment disorder with depressed mood: Secondary | ICD-10-CM | POA: Diagnosis not present

## 2017-12-05 DIAGNOSIS — F4321 Adjustment disorder with depressed mood: Secondary | ICD-10-CM | POA: Diagnosis not present

## 2017-12-06 ENCOUNTER — Encounter: Payer: BLUE CROSS/BLUE SHIELD | Admitting: Internal Medicine

## 2017-12-12 DIAGNOSIS — F4321 Adjustment disorder with depressed mood: Secondary | ICD-10-CM | POA: Diagnosis not present

## 2017-12-19 ENCOUNTER — Ambulatory Visit (INDEPENDENT_AMBULATORY_CARE_PROVIDER_SITE_OTHER): Payer: BLUE CROSS/BLUE SHIELD | Admitting: Internal Medicine

## 2017-12-19 ENCOUNTER — Encounter: Payer: Self-pay | Admitting: Internal Medicine

## 2017-12-19 VITALS — BP 114/80 | HR 82 | Ht 68.0 in | Wt 267.2 lb

## 2017-12-19 DIAGNOSIS — K641 Second degree hemorrhoids: Secondary | ICD-10-CM | POA: Diagnosis not present

## 2017-12-19 DIAGNOSIS — B350 Tinea barbae and tinea capitis: Secondary | ICD-10-CM | POA: Diagnosis not present

## 2017-12-19 DIAGNOSIS — L290 Pruritus ani: Secondary | ICD-10-CM

## 2017-12-19 DIAGNOSIS — K6289 Other specified diseases of anus and rectum: Secondary | ICD-10-CM | POA: Diagnosis not present

## 2017-12-19 DIAGNOSIS — K648 Other hemorrhoids: Secondary | ICD-10-CM

## 2017-12-19 MED ORDER — KETOCONAZOLE 2 % EX CREA: TOPICAL_CREAM | CUTANEOUS | 1 refills | Status: DC

## 2017-12-19 NOTE — Patient Instructions (Addendum)
Follow up with Dr Henrene Pastor as needed.  Please call our office in about 3 weeks to let us know how you are doing. Ask to speak to Dr Blanch Media nurse, Vaughan Basta.  We have sent the following medications to your pharmacy for you to pick up at your convenience: Ketoconazole 2% cream-Apply 2 times daily perirectally  HEMORRHOID BANDING PROCEDURE    FOLLOW-UP CARE   1. The procedure you have had should have been relatively painless since the banding of the area involved does not have nerve endings and there is no pain sensation.  The rubber band cuts off the blood supply to the hemorrhoid and the band may fall off as soon as 48 hours after the banding (the band may occasionally be seen in the toilet bowl following a bowel movement). You may notice a temporary feeling of fullness in the rectum which should respond adequately to plain Tylenol or Motrin.  2. Following the banding, avoid strenuous exercise that evening and resume full activity the next day.  A sitz bath (soaking in a warm tub) or bidet is soothing, and can be useful for cleansing the area after bowel movements.     3. To avoid constipation, take two tablespoons of natural wheat bran, natural oat bran, flax, Benefiber or any over the counter fiber supplement and increase your water intake to 7-8 glasses daily.    4. Unless you have been prescribed anorectal medication, do not put anything inside your rectum for two weeks: No suppositories, enemas, fingers, etc.  5. Occasionally, you may have more bleeding than usual after the banding procedure.  This is often from the untreated hemorrhoids rather than the treated one.  Don't be concerned if there is a tablespoon or so of blood.  If there is more blood than this, lie flat with your bottom higher than your head and apply an ice pack to the area. If the bleeding does not stop within a half an hour or if you feel faint, call our office at [phone] or go to the emergency room.  6. Problems  are not common; however, if there is a substantial amount of bleeding, severe pain, chills, fever or difficulty passing urine (very rare) or other problems, you should call us at [phone] or report to the nearest emergency room.  7. Do not stay seated continuously for more than 2-3 hours for a day or two after the procedure.  Tighten your buttock muscles 10-15 times every two hours and take 10-15 deep breaths every 1-2 hours.  Do not spend more than a few minutes on the toilet if you cannot empty your bowel; instead re-visit the toilet at a later time.

## 2017-12-20 NOTE — Progress Notes (Signed)
Kevin Newman is a 47 year old male with IBS D who returns for hemorrhoidal banding History of symptomatic internal hemorrhoids Symptoms prior to banding were predominantly perianal itching but also rare rectal bleeding after wiping Overall he feels that symptoms have improved though he still has perianal itching and is using Preparation H suppository and RectiCare.  RectiCare has been helpful He has some irritation and stinging and cracking which he reports in his intergluteal cleft superiorly   PROCEDURE NOTE:  The patient presents with symptomatic grade 2 internal hemorrhoids, requesting rubber band ligation of his hemorrhoidal disease.  All risks, benefits and alternative forms of therapy were described and informed consent was obtained.   The anorectum was pre-medicated with 0.125% nitroglycerin ointment Perianal skin reveals erythema with some small satellite lesions most consistent with perianal fungal infection The decision was made to band the right posterior internal hemorrhoid (left lateral and right anterior previously banded), and the [place] was used to perform band ligation without complication.   Digital anorectal examination was then performed to assure proper positioning of the band, and to adjust the banded tissue as required.  The patient was discharged home without pain or other issues.  Dietary and behavioral recommendations were given and along with follow-up instructions.     The following adjunctive treatments were recommended: Begin ketoconazole 2% cream perianal skin twice daily x7 to 14 days for perianal tinea/fungal infection Dietary modification list given today as to which food and beverages worsen perianal itching (avoid coffee, tea, alcohol, citrus fruits)  The patient will return as scheduled needed for follow-up with Dr. Henrene Pastor who referred the patient for banding. No complications were encountered and the patient tolerated the procedure  well.

## 2018-01-09 ENCOUNTER — Encounter: Payer: Self-pay | Admitting: Family Medicine

## 2018-01-09 DIAGNOSIS — I10 Essential (primary) hypertension: Secondary | ICD-10-CM

## 2018-01-09 MED ORDER — AMLODIPINE BESYLATE 5 MG PO TABS: ORAL_TABLET | ORAL | 3 refills | Status: DC

## 2018-01-17 ENCOUNTER — Telehealth: Payer: Self-pay | Admitting: Internal Medicine

## 2018-01-18 NOTE — Telephone Encounter (Signed)
Left message for pt to call back  °

## 2018-01-22 NOTE — Telephone Encounter (Signed)
Pt never called back.

## 2018-01-23 NOTE — Telephone Encounter (Signed)
I would recommend the following:  1. Dietary modification for anal itch (this can help a lot) Avoid: Coffee  Cola  Beer  Tomatoes  Chocolate  Tea  Citrus fruits   2. Unmedicated talcum powder after showers and BMs to perianal skin  3. Dermatology referral to evaluate the perianal skin and ensure no other process contributing to symptoms  Have him let me know after the above and we can continue to work on symptoms if needed

## 2018-01-23 NOTE — Telephone Encounter (Signed)
See my response JMP

## 2018-02-02 DIAGNOSIS — F4321 Adjustment disorder with depressed mood: Secondary | ICD-10-CM | POA: Diagnosis not present

## 2018-02-19 DIAGNOSIS — F4321 Adjustment disorder with depressed mood: Secondary | ICD-10-CM | POA: Diagnosis not present

## 2018-03-07 DIAGNOSIS — B372 Candidiasis of skin and nail: Secondary | ICD-10-CM | POA: Diagnosis not present

## 2018-03-07 DIAGNOSIS — L290 Pruritus ani: Secondary | ICD-10-CM | POA: Diagnosis not present

## 2018-03-15 DIAGNOSIS — F4321 Adjustment disorder with depressed mood: Secondary | ICD-10-CM | POA: Diagnosis not present

## 2018-04-02 ENCOUNTER — Ambulatory Visit: Payer: BLUE CROSS/BLUE SHIELD | Admitting: Family Medicine

## 2018-04-02 ENCOUNTER — Encounter: Payer: Self-pay | Admitting: Family Medicine

## 2018-04-02 VITALS — BP 118/84 | HR 94 | Temp 98.6°F | Ht 68.0 in | Wt 271.2 lb

## 2018-04-02 DIAGNOSIS — I10 Essential (primary) hypertension: Secondary | ICD-10-CM | POA: Diagnosis not present

## 2018-04-02 NOTE — Progress Notes (Signed)
Chief Complaint  Patient presents with  . Follow-up    Subjective Kevin Newman is a 48 y.o. male who presents for hypertension follow up. He does monitor home blood pressures. Blood pressures ranging from 110's/70-80's on average. He is compliant with medications- chlorthalidone 25 mg/d, Norvasc 5 mg/d, Toprol XL 25 mg/d Patient has these side effects of medication: none He is adhering to a healthy diet overall. Current exercise: walking Has lost 6 lbs over past mo.    Past Medical History:  Diagnosis Date  . Anxiety   . Diverticulosis   . Essential hypertension 04/22/2016  . Hypogonadism male 08/18/2016  . Internal hemorrhoids   . Sleep apnea    boderline, have CPAP    Review of Systems Cardiovascular: no chest pain Respiratory:  no shortness of breath  Exam BP 118/84 (BP Location: Left Arm, Patient Position: Sitting, Cuff Size: Large)   Pulse 94   Temp 98.6 F (37 C) (Oral)   Ht 5\' 8"  (1.727 m)   Wt 271 lb 4 oz (123 kg)   SpO2 97%   BMI 41.24 kg/m  General:  well developed, well nourished, in no apparent distress Heart: RRR, no bruits, no LE edema Lungs: clear to auscultation, no accessory muscle use Psych: well oriented with normal range of affect and appropriate judgment/insight  Essential hypertension  Morbid obesity (HCC)  Cont current meds. Ok to stop checking BP at home.  Goal weight for 6 mo is 240-250 lbs. Long term goal is 200 lbs. He will accomplish this by improving his diet and exercising which he was counseled on. F/u in 6 mo for CPE. The patient voiced understanding and agreement to the plan.  [place], DO 04/02/18  4:22 PM

## 2018-04-02 NOTE — Progress Notes (Signed)
Pre visit review using our clinic review tool, if applicable. No additional management support is needed unless otherwise documented below in the visit note. 

## 2018-04-02 NOTE — Patient Instructions (Addendum)
Keep the diet clean and stay active.  Goal weight (in 6 mo, early August)- 240-250 lbs on your schedule.  Long term goal is 200 lbs.   Aim to do some physical exertion for 150 minutes per week. This is typically divided into 5 days per week, 30 minutes per day. The activity should be enough to get your heart rate up. Anything is better than nothing if you have time constraints.  Because your blood pressure is well-controlled, you no longer have to check your blood pressure at home anymore unless you wish. Some people check it twice daily every day and some people stop altogether. Either or anything in between is fine. Strong work!  Let us know if you need anything.

## 2018-04-03 DIAGNOSIS — F4321 Adjustment disorder with depressed mood: Secondary | ICD-10-CM | POA: Diagnosis not present

## 2018-04-25 DIAGNOSIS — L219 Seborrheic dermatitis, unspecified: Secondary | ICD-10-CM | POA: Diagnosis not present

## 2018-04-25 DIAGNOSIS — D1801 Hemangioma of skin and subcutaneous tissue: Secondary | ICD-10-CM | POA: Diagnosis not present

## 2018-04-25 DIAGNOSIS — L821 Other seborrheic keratosis: Secondary | ICD-10-CM | POA: Diagnosis not present

## 2018-04-25 DIAGNOSIS — D225 Melanocytic nevi of trunk: Secondary | ICD-10-CM | POA: Diagnosis not present

## 2018-04-26 DIAGNOSIS — F4321 Adjustment disorder with depressed mood: Secondary | ICD-10-CM | POA: Diagnosis not present

## 2018-05-10 DIAGNOSIS — F4321 Adjustment disorder with depressed mood: Secondary | ICD-10-CM | POA: Diagnosis not present

## 2018-05-17 ENCOUNTER — Other Ambulatory Visit: Payer: Self-pay | Admitting: Family Medicine

## 2018-05-17 DIAGNOSIS — I10 Essential (primary) hypertension: Secondary | ICD-10-CM

## 2018-06-01 DIAGNOSIS — F4321 Adjustment disorder with depressed mood: Secondary | ICD-10-CM | POA: Diagnosis not present

## 2018-06-12 DIAGNOSIS — F4321 Adjustment disorder with depressed mood: Secondary | ICD-10-CM | POA: Diagnosis not present

## 2018-06-25 DIAGNOSIS — F4321 Adjustment disorder with depressed mood: Secondary | ICD-10-CM | POA: Diagnosis not present

## 2018-07-09 DIAGNOSIS — F4321 Adjustment disorder with depressed mood: Secondary | ICD-10-CM | POA: Diagnosis not present

## 2018-07-24 DIAGNOSIS — F4321 Adjustment disorder with depressed mood: Secondary | ICD-10-CM | POA: Diagnosis not present

## 2018-08-03 DIAGNOSIS — F4321 Adjustment disorder with depressed mood: Secondary | ICD-10-CM | POA: Diagnosis not present

## 2018-08-06 DIAGNOSIS — M542 Cervicalgia: Secondary | ICD-10-CM | POA: Diagnosis not present

## 2018-08-06 DIAGNOSIS — M9901 Segmental and somatic dysfunction of cervical region: Secondary | ICD-10-CM | POA: Diagnosis not present

## 2018-08-06 DIAGNOSIS — M546 Pain in thoracic spine: Secondary | ICD-10-CM | POA: Diagnosis not present

## 2018-08-06 DIAGNOSIS — M9902 Segmental and somatic dysfunction of thoracic region: Secondary | ICD-10-CM | POA: Diagnosis not present

## 2018-08-10 DIAGNOSIS — M9901 Segmental and somatic dysfunction of cervical region: Secondary | ICD-10-CM | POA: Diagnosis not present

## 2018-08-10 DIAGNOSIS — M9902 Segmental and somatic dysfunction of thoracic region: Secondary | ICD-10-CM | POA: Diagnosis not present

## 2018-08-10 DIAGNOSIS — M542 Cervicalgia: Secondary | ICD-10-CM | POA: Diagnosis not present

## 2018-08-10 DIAGNOSIS — M546 Pain in thoracic spine: Secondary | ICD-10-CM | POA: Diagnosis not present

## 2018-08-11 DIAGNOSIS — L600 Ingrowing nail: Secondary | ICD-10-CM | POA: Diagnosis not present

## 2018-08-11 DIAGNOSIS — M79671 Pain in right foot: Secondary | ICD-10-CM | POA: Diagnosis not present

## 2018-08-11 DIAGNOSIS — M79674 Pain in right toe(s): Secondary | ICD-10-CM | POA: Diagnosis not present

## 2018-08-11 DIAGNOSIS — M79675 Pain in left toe(s): Secondary | ICD-10-CM | POA: Diagnosis not present

## 2018-08-17 DIAGNOSIS — F4321 Adjustment disorder with depressed mood: Secondary | ICD-10-CM | POA: Diagnosis not present

## 2018-08-20 DIAGNOSIS — M9901 Segmental and somatic dysfunction of cervical region: Secondary | ICD-10-CM | POA: Diagnosis not present

## 2018-08-20 DIAGNOSIS — M542 Cervicalgia: Secondary | ICD-10-CM | POA: Diagnosis not present

## 2018-08-20 DIAGNOSIS — M546 Pain in thoracic spine: Secondary | ICD-10-CM | POA: Diagnosis not present

## 2018-08-20 DIAGNOSIS — M9902 Segmental and somatic dysfunction of thoracic region: Secondary | ICD-10-CM | POA: Diagnosis not present

## 2018-08-24 DIAGNOSIS — M9902 Segmental and somatic dysfunction of thoracic region: Secondary | ICD-10-CM | POA: Diagnosis not present

## 2018-08-24 DIAGNOSIS — M542 Cervicalgia: Secondary | ICD-10-CM | POA: Diagnosis not present

## 2018-08-24 DIAGNOSIS — M546 Pain in thoracic spine: Secondary | ICD-10-CM | POA: Diagnosis not present

## 2018-08-24 DIAGNOSIS — M9901 Segmental and somatic dysfunction of cervical region: Secondary | ICD-10-CM | POA: Diagnosis not present

## 2018-08-24 DIAGNOSIS — F4321 Adjustment disorder with depressed mood: Secondary | ICD-10-CM | POA: Diagnosis not present

## 2018-09-10 DIAGNOSIS — M542 Cervicalgia: Secondary | ICD-10-CM | POA: Diagnosis not present

## 2018-09-10 DIAGNOSIS — M546 Pain in thoracic spine: Secondary | ICD-10-CM | POA: Diagnosis not present

## 2018-09-10 DIAGNOSIS — M9901 Segmental and somatic dysfunction of cervical region: Secondary | ICD-10-CM | POA: Diagnosis not present

## 2018-09-10 DIAGNOSIS — M9902 Segmental and somatic dysfunction of thoracic region: Secondary | ICD-10-CM | POA: Diagnosis not present

## 2018-09-14 DIAGNOSIS — M545 Low back pain: Secondary | ICD-10-CM | POA: Diagnosis not present

## 2018-09-14 DIAGNOSIS — M9901 Segmental and somatic dysfunction of cervical region: Secondary | ICD-10-CM | POA: Diagnosis not present

## 2018-09-14 DIAGNOSIS — M9905 Segmental and somatic dysfunction of pelvic region: Secondary | ICD-10-CM | POA: Diagnosis not present

## 2018-09-14 DIAGNOSIS — M546 Pain in thoracic spine: Secondary | ICD-10-CM | POA: Diagnosis not present

## 2018-09-14 DIAGNOSIS — M9902 Segmental and somatic dysfunction of thoracic region: Secondary | ICD-10-CM | POA: Diagnosis not present

## 2018-09-14 DIAGNOSIS — M25551 Pain in right hip: Secondary | ICD-10-CM | POA: Diagnosis not present

## 2018-09-14 DIAGNOSIS — M9903 Segmental and somatic dysfunction of lumbar region: Secondary | ICD-10-CM | POA: Diagnosis not present

## 2018-09-14 DIAGNOSIS — M542 Cervicalgia: Secondary | ICD-10-CM | POA: Diagnosis not present

## 2018-09-21 DIAGNOSIS — M542 Cervicalgia: Secondary | ICD-10-CM | POA: Diagnosis not present

## 2018-09-21 DIAGNOSIS — M9902 Segmental and somatic dysfunction of thoracic region: Secondary | ICD-10-CM | POA: Diagnosis not present

## 2018-09-21 DIAGNOSIS — M9901 Segmental and somatic dysfunction of cervical region: Secondary | ICD-10-CM | POA: Diagnosis not present

## 2018-09-21 DIAGNOSIS — M546 Pain in thoracic spine: Secondary | ICD-10-CM | POA: Diagnosis not present

## 2018-10-01 DIAGNOSIS — F4321 Adjustment disorder with depressed mood: Secondary | ICD-10-CM | POA: Diagnosis not present

## 2018-10-03 ENCOUNTER — Other Ambulatory Visit: Payer: Self-pay | Admitting: Family Medicine

## 2018-10-03 ENCOUNTER — Ambulatory Visit (INDEPENDENT_AMBULATORY_CARE_PROVIDER_SITE_OTHER): Payer: BC Managed Care – PPO | Admitting: Family Medicine

## 2018-10-03 ENCOUNTER — Encounter: Payer: Self-pay | Admitting: Family Medicine

## 2018-10-03 ENCOUNTER — Other Ambulatory Visit: Payer: Self-pay

## 2018-10-03 VITALS — BP 120/80 | HR 76 | Temp 98.3°F | Ht 68.0 in | Wt 260.0 lb

## 2018-10-03 DIAGNOSIS — Z125 Encounter for screening for malignant neoplasm of prostate: Secondary | ICD-10-CM | POA: Diagnosis not present

## 2018-10-03 DIAGNOSIS — E876 Hypokalemia: Secondary | ICD-10-CM

## 2018-10-03 DIAGNOSIS — Z0000 Encounter for general adult medical examination without abnormal findings: Secondary | ICD-10-CM

## 2018-10-03 LAB — CBC
HCT: 48.7 % (ref 39.0–52.0)
Hemoglobin: 16.9 g/dL (ref 13.0–17.0)
MCHC: 34.6 g/dL (ref 30.0–36.0)
MCV: 94.2 fl (ref 78.0–100.0)
Platelets: 255 10*3/uL (ref 150.0–400.0)
RBC: 5.17 Mil/uL (ref 4.22–5.81)
RDW: 13.2 % (ref 11.5–15.5)
WBC: 6 10*3/uL (ref 4.0–10.5)

## 2018-10-03 LAB — LIPID PANEL
Cholesterol: 175 mg/dL (ref 0–200)
HDL: 49.4 mg/dL (ref >39.00)
LDL Cholesterol: 91 mg/dL (ref 0–99)
NonHDL: 125.22
Total CHOL/HDL Ratio: 4
Triglycerides: 172 mg/dL — ABNORMAL HIGH (ref 0.0–149.0)
VLDL: 34.4 mg/dL (ref 0.0–40.0)

## 2018-10-03 LAB — COMPREHENSIVE METABOLIC PANEL
ALT: 12 U/L (ref 0–53)
AST: 22 U/L (ref 0–37)
Albumin: 4.8 g/dL (ref 3.5–5.2)
Alkaline Phosphatase: 65 U/L (ref 39–117)
BUN: 17 mg/dL (ref 6–23)
CO2: 32 mEq/L (ref 19–32)
Calcium: 9.9 mg/dL (ref 8.4–10.5)
Chloride: 98 mEq/L (ref 96–112)
Creatinine, Ser: 1.07 mg/dL (ref 0.40–1.50)
GFR: 74.32 mL/min (ref >60.00)
Glucose, Bld: 95 mg/dL (ref 70–99)
Potassium: 3.4 mEq/L — ABNORMAL LOW (ref 3.5–5.1)
Sodium: 138 mEq/L (ref 135–145)
Total Bilirubin: 2.4 mg/dL — ABNORMAL HIGH (ref 0.2–1.2)
Total Protein: 7.1 g/dL (ref 6.0–8.3)

## 2018-10-03 LAB — PSA: PSA: 0.66 ng/mL (ref 0.10–4.00)

## 2018-10-03 MED ORDER — POTASSIUM CHLORIDE CRYS ER 10 MEQ PO TBCR: 20.0000 meq | EXTENDED_RELEASE_TABLET | Freq: Every day | ORAL | 2 refills | Status: DC

## 2018-10-03 NOTE — Patient Instructions (Signed)
Give us 2-3 business days to get the results of your labs back.   Keep the diet clean and stay active.  Let us know if you need anything. 

## 2018-10-03 NOTE — Progress Notes (Signed)
Chief Complaint  Patient presents with  . Follow-up    Well Male Kevin Newman is here for a complete physical.   His last physical was >1 year ago.  Current diet: in general, a "pretty good" diet.   Current exercise: stretching, leg resistance, core work Weight trend: had lost some weight intentionally Daytime fatigue? No. Seat belt? Yes.    Health maintenance Tetanus- Yes HIV- Yes  Past Medical History:  Diagnosis Date  . Anxiety   . Diverticulosis   . Essential hypertension 04/22/2016  . Hypogonadism male 08/18/2016  . Internal hemorrhoids   . Sleep apnea    boderline, have CPAP     Past Surgical History:  Procedure Laterality Date  . NO PAST SURGERIES      Medications  Current Outpatient Medications on File Prior to Visit  Medication Sig Dispense Refill  . amLODipine (NORVASC) 5 MG tablet TAKE 1 TABLET(5 MG) BY MOUTH DAILY 90 tablet 3  . chlorthalidone (HYGROTON) 25 MG tablet TAKE 1 TABLET(25 MG) BY MOUTH DAILY 90 tablet 3  . Cholecalciferol (VITAMIN D-3 PO) Take by mouth.    . Coenzyme Q10 (COQ10) 100 MG CAPS Take 100 mg by mouth daily.    . Garlic 672 MG TABS Take 500 mg by mouth daily.    Marland Kitchen loratadine (CLARITIN) 10 MG tablet     . Magnesium 400 MG TABS Take 400 mg by mouth 2 (two) times daily.    . metoprolol succinate (TOPROL-XL) 25 MG 24 hr tablet TAKE 1 TABLET(25 MG) BY MOUTH DAILY 90 tablet 3  . Multiple Vitamins-Minerals (MULTIVITAMIN ADULT PO) Take by mouth.    Marland Kitchen OVER THE COUNTER MEDICATION BIOTIC DETOXIFICATION SUPPORT.  Take 1 tablet daily    . Taurine 1000 MG CAPS Take 2,000 mg by mouth 3 (three) times daily.    . vitamin E 1000 UNIT capsule Take 1,000 Units by mouth daily.     Allergies Allergies  Allergen Reactions  . Amoxil [Amoxicillin]   . Sulfa Antibiotics     Family History Family History  Problem Relation Age of Onset  . Heart disease Maternal Grandmother 80  . Thyroid disease Mother   . Gallbladder disease Mother   . Gallbladder  disease Father   . Heart Problems Father   . Heart attack Maternal Grandfather   . Stroke Maternal Grandfather   . Breast cancer Paternal Grandmother   . Prostate cancer Paternal Grandfather   . Cancer Maternal Uncle        type unknown    Review of Systems: Constitutional: no fevers or chills Eye:  no recent significant change in vision Ear/Nose/Mouth/Throat:  Ears:  no tinnitus or hearing loss Nose/Mouth/Throat:  no complaints of nasal congestion, no sore throat Cardiovascular:  no chest pain, no palpitations Respiratory:  no cough and no shortness of breath Gastrointestinal:  +diarrhea and pain intermittently GU:  Male: negative for dysuria, frequency, and incontinence and negative for prostate symptoms Musculoskeletal/Extremities:  no pain, redness, or swelling of the joints Integumentary (Skin/Breast):  no abnormal skin lesions reported Neurologic:  no headaches, no numbness, tingling Endocrine: No unexpected weight changes Hematologic/Lymphatic:  no night sweats  Exam BP 120/80 (BP Location: Left Arm, Patient Position: Sitting, Cuff Size: Normal)   Pulse 76   Temp 98.3 F (36.8 C) (Oral)   Ht 5\' 8"  (1.727 m)   Wt 260 lb (117.9 kg)   SpO2 96%   BMI 39.53 kg/m  General:  well developed, well nourished, in no apparent  distress Skin:  no significant moles, warts, or growths Head:  no masses, lesions, or tenderness Eyes:  pupils equal and round, sclera anicteric without injection Ears:  canals without lesions, TMs shiny without retraction, no obvious effusion, no erythema Nose:  nares patent, septum midline, mucosa normal Throat/Pharynx:  lips and gingiva without lesion; tongue and uvula midline; non-inflamed pharynx; no exudates or postnasal drainage Neck: neck supple without adenopathy, thyromegaly, or masses Lungs:  clear to auscultation, breath sounds equal bilaterally, no respiratory distress Cardio:  regular rate and rhythm, no bruits, no LE edema Abdomen:  abdomen  soft, no ttp; bowel sounds normal; no masses or organomegaly Rectal: Deferred Musculoskeletal:  symmetrical muscle groups noted without atrophy or deformity Extremities:  no clubbing, cyanosis, or edema, no deformities, no skin discoloration Neuro:  gait normal; deep tendon reflexes normal and symmetric Psych: well oriented with normal range of affect and appropriate judgment/insight  Assessment and Plan  Well adult exam - Plan: CBC, Comprehensive metabolic panel, Lipid panel  Screening for prostate cancer - Plan: PSA   Well 48 y.o. male. Counseled on diet and exercise. Counseled on risks and benefits of prostate cancer screening with psa. He agrees to undergo testing. Discussed some approaches to address his continued abd issues. As it is better since cutting out sweeteners, he would like to continue improving his diet. Discussed low FODMAP and medication (Elavil).  Other orders as above. Follow up in 6 mo pending the above workup. The patient voiced understanding and agreement to the plan.  [place], DO 10/03/18 9:07 AM

## 2018-10-05 DIAGNOSIS — M9902 Segmental and somatic dysfunction of thoracic region: Secondary | ICD-10-CM | POA: Diagnosis not present

## 2018-10-05 DIAGNOSIS — M9901 Segmental and somatic dysfunction of cervical region: Secondary | ICD-10-CM | POA: Diagnosis not present

## 2018-10-05 DIAGNOSIS — M542 Cervicalgia: Secondary | ICD-10-CM | POA: Diagnosis not present

## 2018-10-05 DIAGNOSIS — M9905 Segmental and somatic dysfunction of pelvic region: Secondary | ICD-10-CM | POA: Diagnosis not present

## 2018-10-05 DIAGNOSIS — M25551 Pain in right hip: Secondary | ICD-10-CM | POA: Diagnosis not present

## 2018-10-05 DIAGNOSIS — M9903 Segmental and somatic dysfunction of lumbar region: Secondary | ICD-10-CM | POA: Diagnosis not present

## 2018-10-05 DIAGNOSIS — M546 Pain in thoracic spine: Secondary | ICD-10-CM | POA: Diagnosis not present

## 2018-10-05 DIAGNOSIS — M545 Low back pain: Secondary | ICD-10-CM | POA: Diagnosis not present

## 2018-10-12 ENCOUNTER — Other Ambulatory Visit (INDEPENDENT_AMBULATORY_CARE_PROVIDER_SITE_OTHER): Payer: BC Managed Care – PPO

## 2018-10-12 ENCOUNTER — Other Ambulatory Visit: Payer: Self-pay

## 2018-10-12 DIAGNOSIS — E876 Hypokalemia: Secondary | ICD-10-CM | POA: Diagnosis not present

## 2018-10-12 LAB — BASIC METABOLIC PANEL
BUN: 20 mg/dL (ref 6–23)
CO2: 30 mEq/L (ref 19–32)
Calcium: 9.7 mg/dL (ref 8.4–10.5)
Chloride: 99 mEq/L (ref 96–112)
Creatinine, Ser: 1.05 mg/dL (ref 0.40–1.50)
GFR: 75.94 mL/min (ref >60.00)
Glucose, Bld: 92 mg/dL (ref 70–99)
Potassium: 3.5 mEq/L (ref 3.5–5.1)
Sodium: 138 mEq/L (ref 135–145)

## 2018-10-12 LAB — MAGNESIUM: Magnesium: 2 mg/dL (ref 1.5–2.5)

## 2018-10-28 IMAGING — CT CT CHEST W/O CM
2 of 3 series · 15 of 36 positions shown, 18 images · non-contrast
Comparison: 04/07/2016

CLINICAL DATA: Left sided chest pain which radiates to left arm
with nausea only x 1 week, Hx: HTN.

EXAM:
CT CHEST WITHOUT CONTRAST
TECHNIQUE: Multidetector CT imaging of the chest was performed following the
standard protocol without IV contrast.

[Series 2: thorax · axial · 0.87mm/px · z∈[-310,-56]mm · 12 of 151 slices shown, 15 images]
[im 12/151  mediastinal]
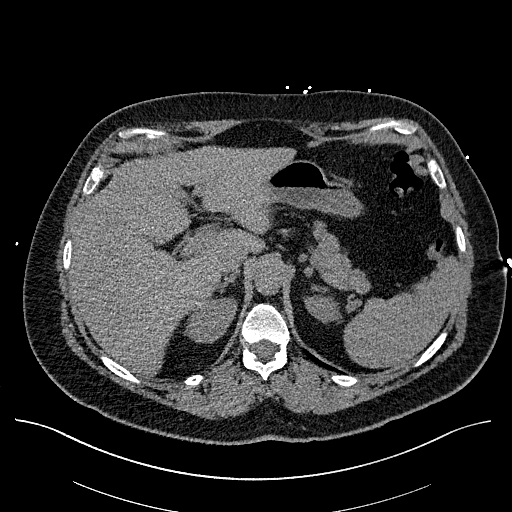
[im 12/151  lung]
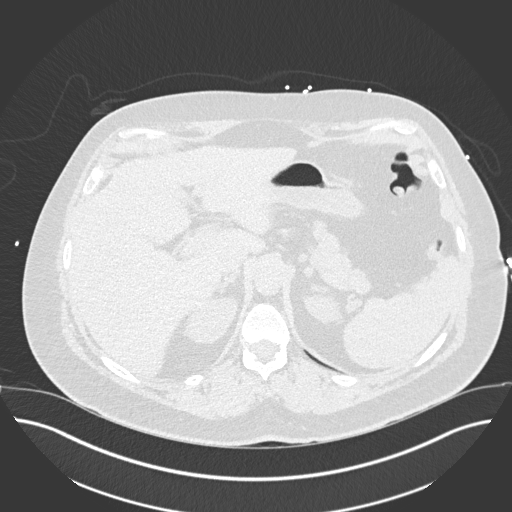
[im 23/151  lung]
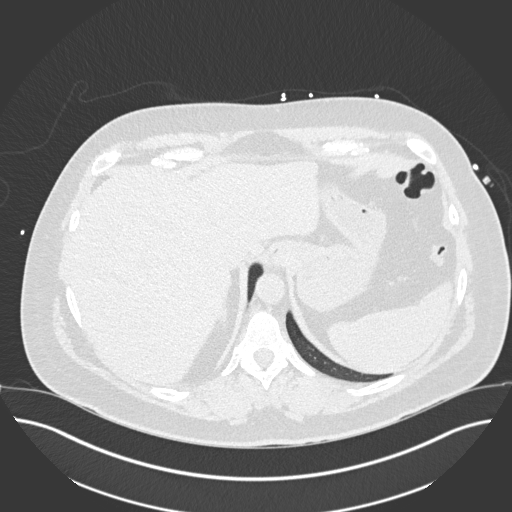
[im 34/151  lung]
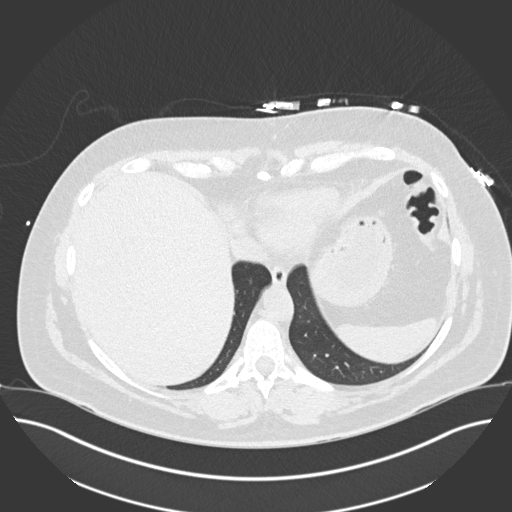
[im 45/151  lung]
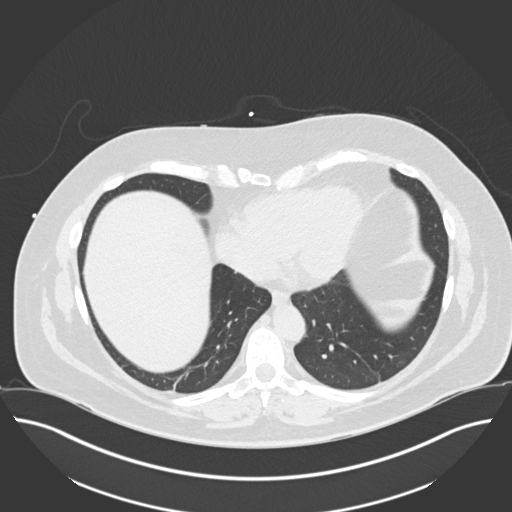
[im 56/151  mediastinal]
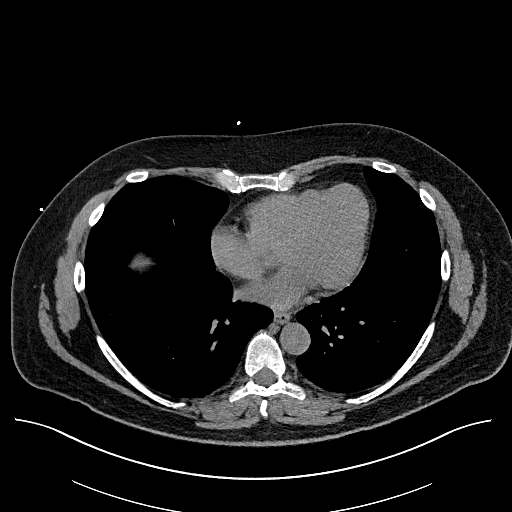
[im 56/151  lung]
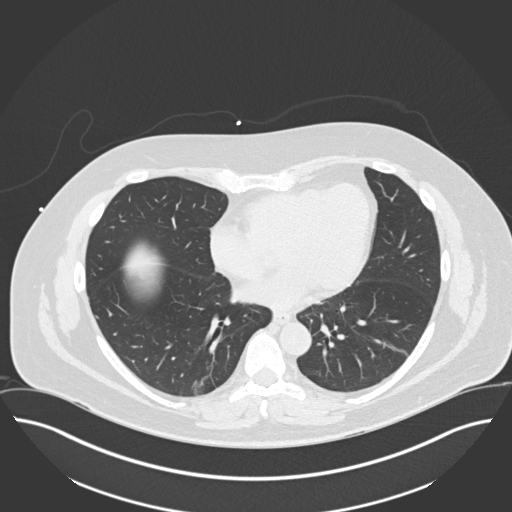
[im 67/151  lung]
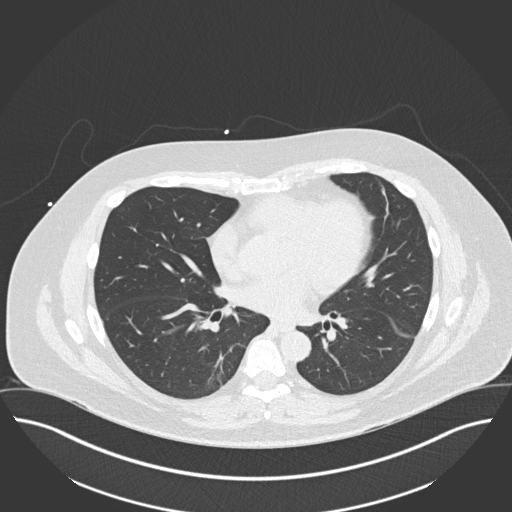
[im 84/151  lung]
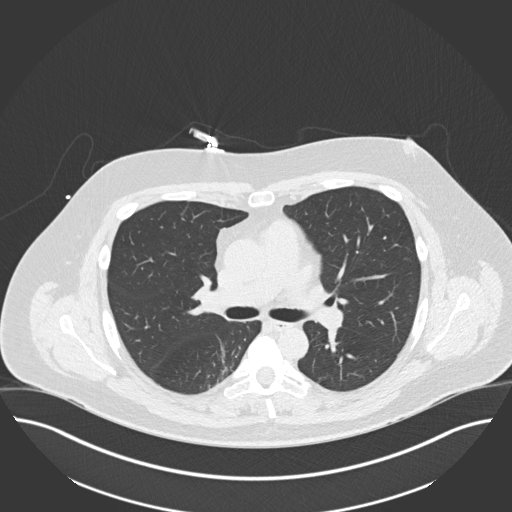
[im 95/151  lung]
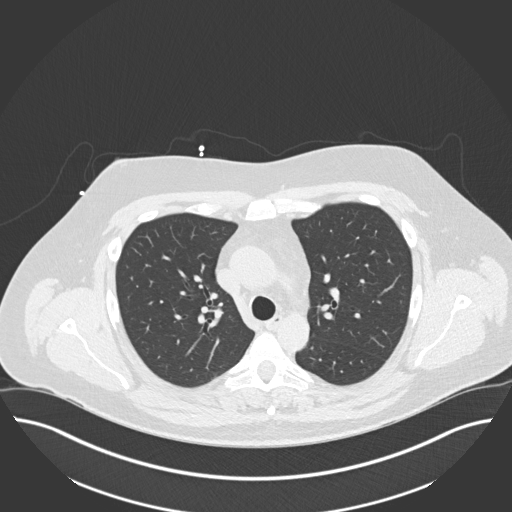
[im 106/151  mediastinal]
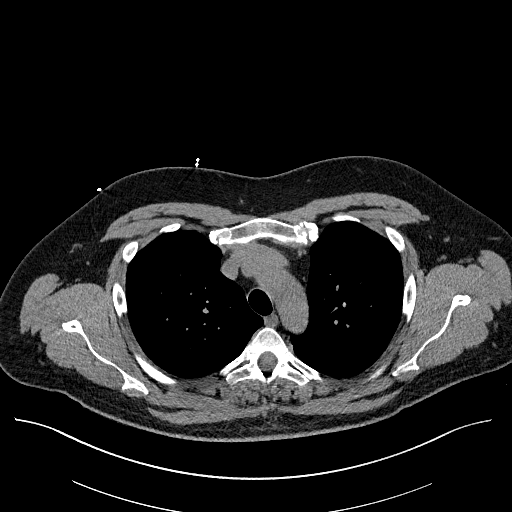
[im 106/151  lung]
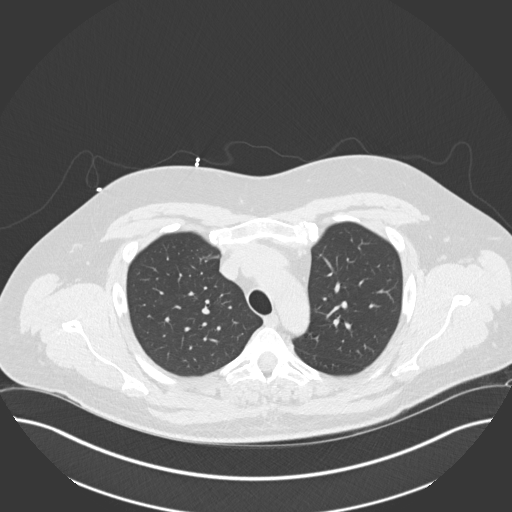
[im 117/151  lung]
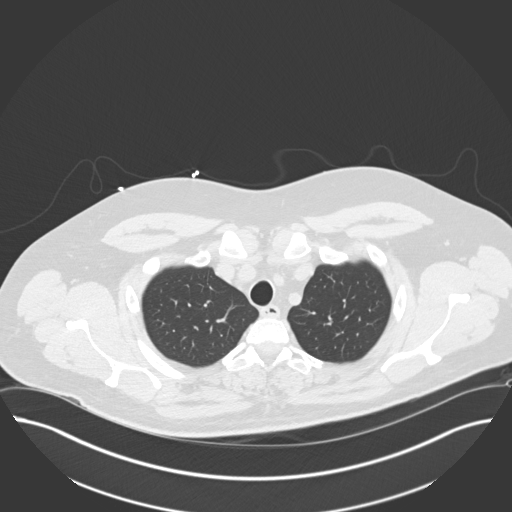
[im 128/151  lung]
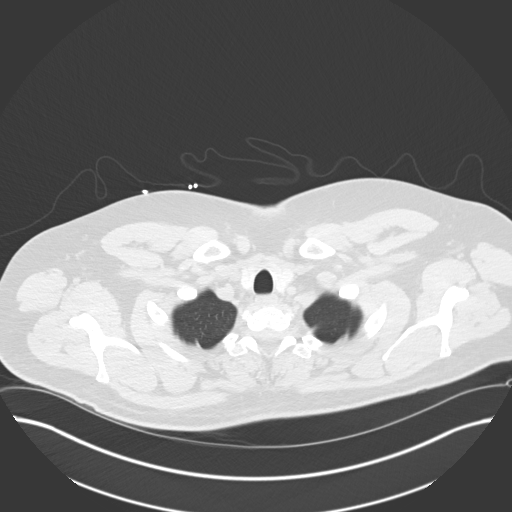
[im 139/151  lung]
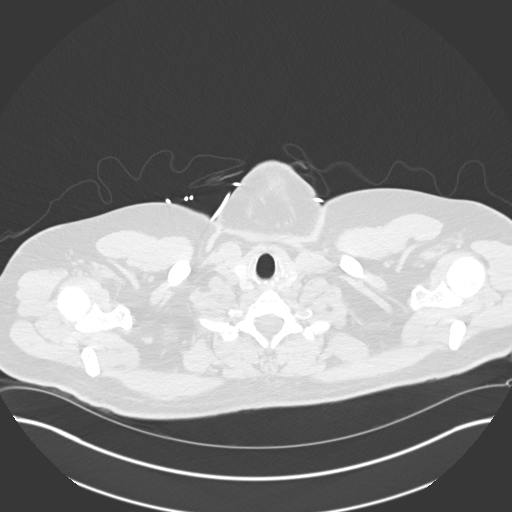

[Series 5: coronal · coronal · 0.66mm/px · 3 of 126 slices shown]
[im 26/126  lung]
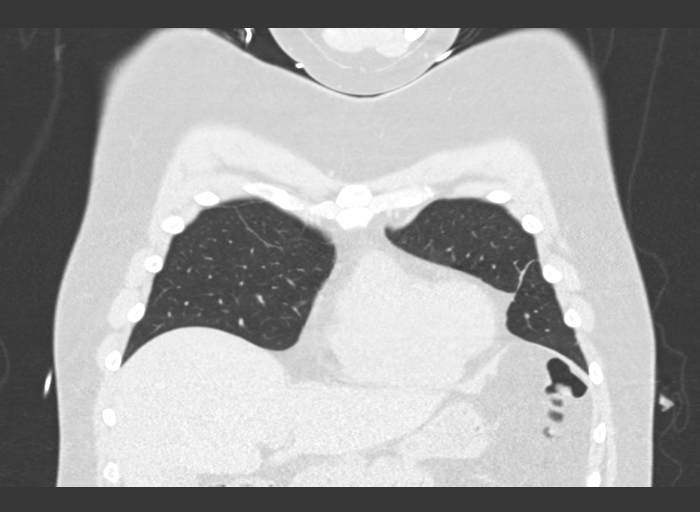
[im 51/126  lung]
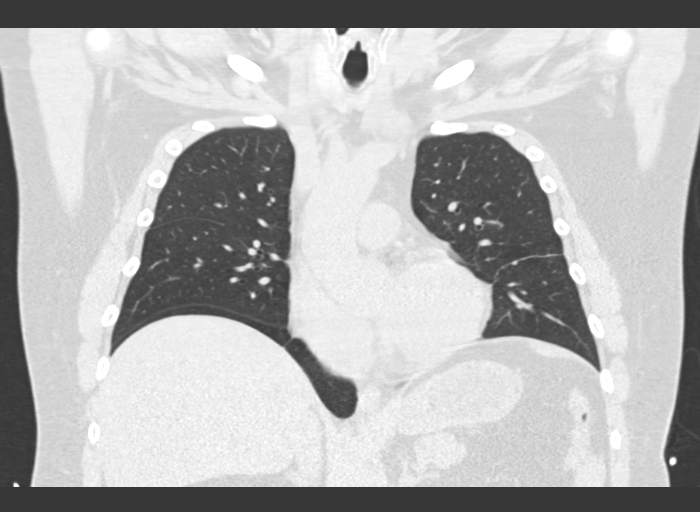
[im 76/126  lung]
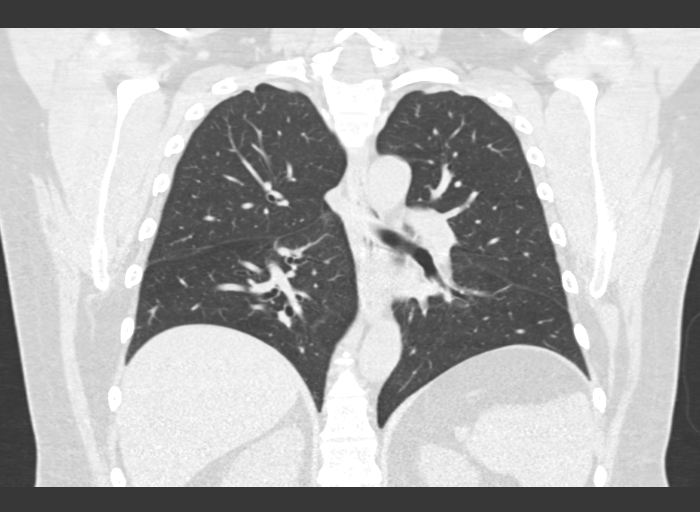

[15 of 36 positions shown; findings below may reference images not displayed]

FINDINGS: Cardiovascular: No significant vascular findings. Normal heart size.
No pericardial effusion.[REDACTED]

Mediastinum/Nodes: No enlarged mediastinal or axillary lymph nodes.
Thyroid gland, trachea, and esophagus demonstrate no significant
findings.

Lungs/Pleura: Minimal bibasilar subsegmental atelectasis. No focal
consolidations or pleural effusions. No pulmonary edema.

Upper Abdomen: No acute abnormality.

Musculoskeletal: No chest wall mass or suspicious bone lesions
identified.
IMPRESSION: No evidence for acute  abnormality.

## 2018-10-28 IMAGING — CR DG CHEST 2V
2 series · 2 of 2 positions shown · non-contrast
Comparison: None in PACs

CLINICAL DATA: One week of chest pain, history of hypertension,
nonsmoker.

EXAM:
CHEST  2 VIEW

[w chest pa]
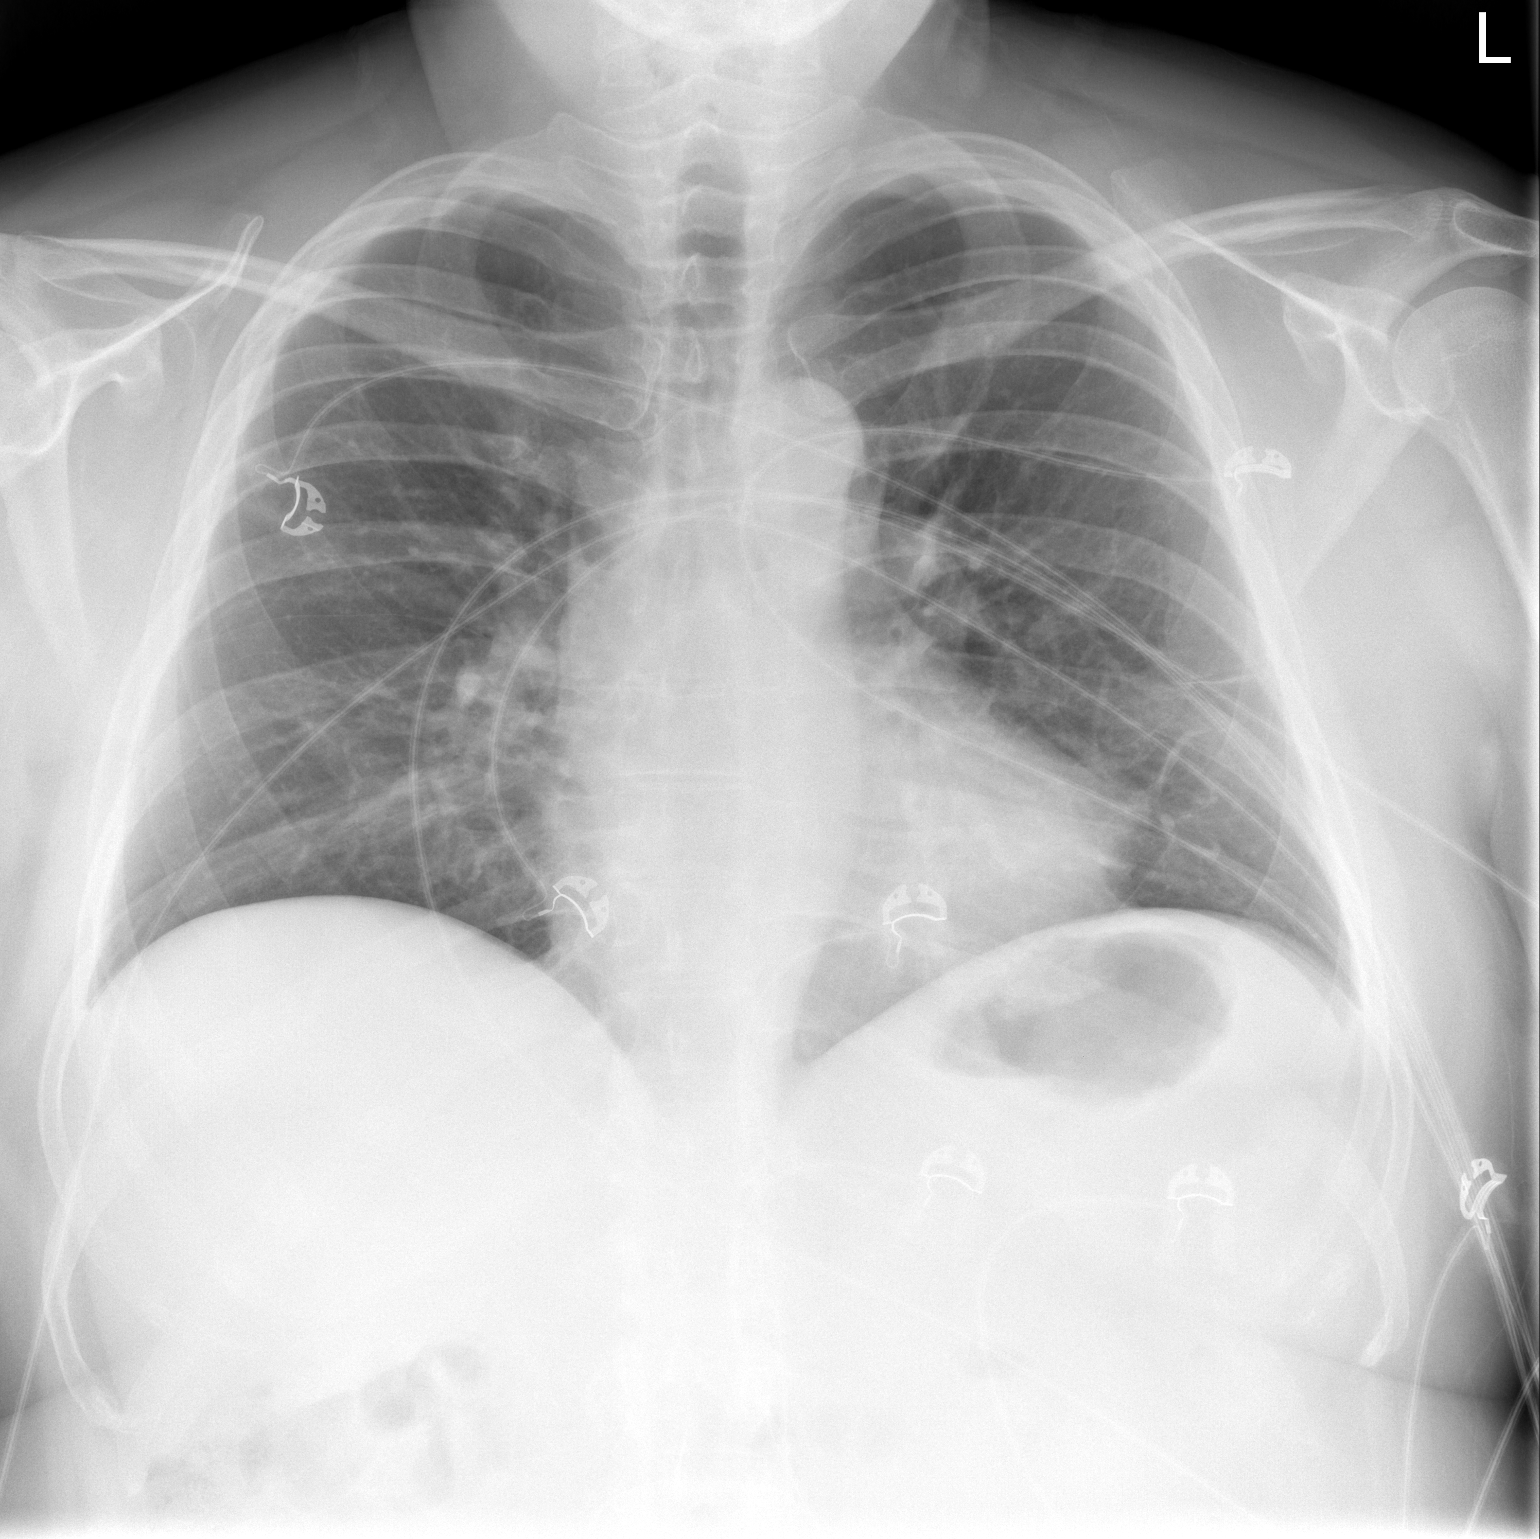

[w chest lat]
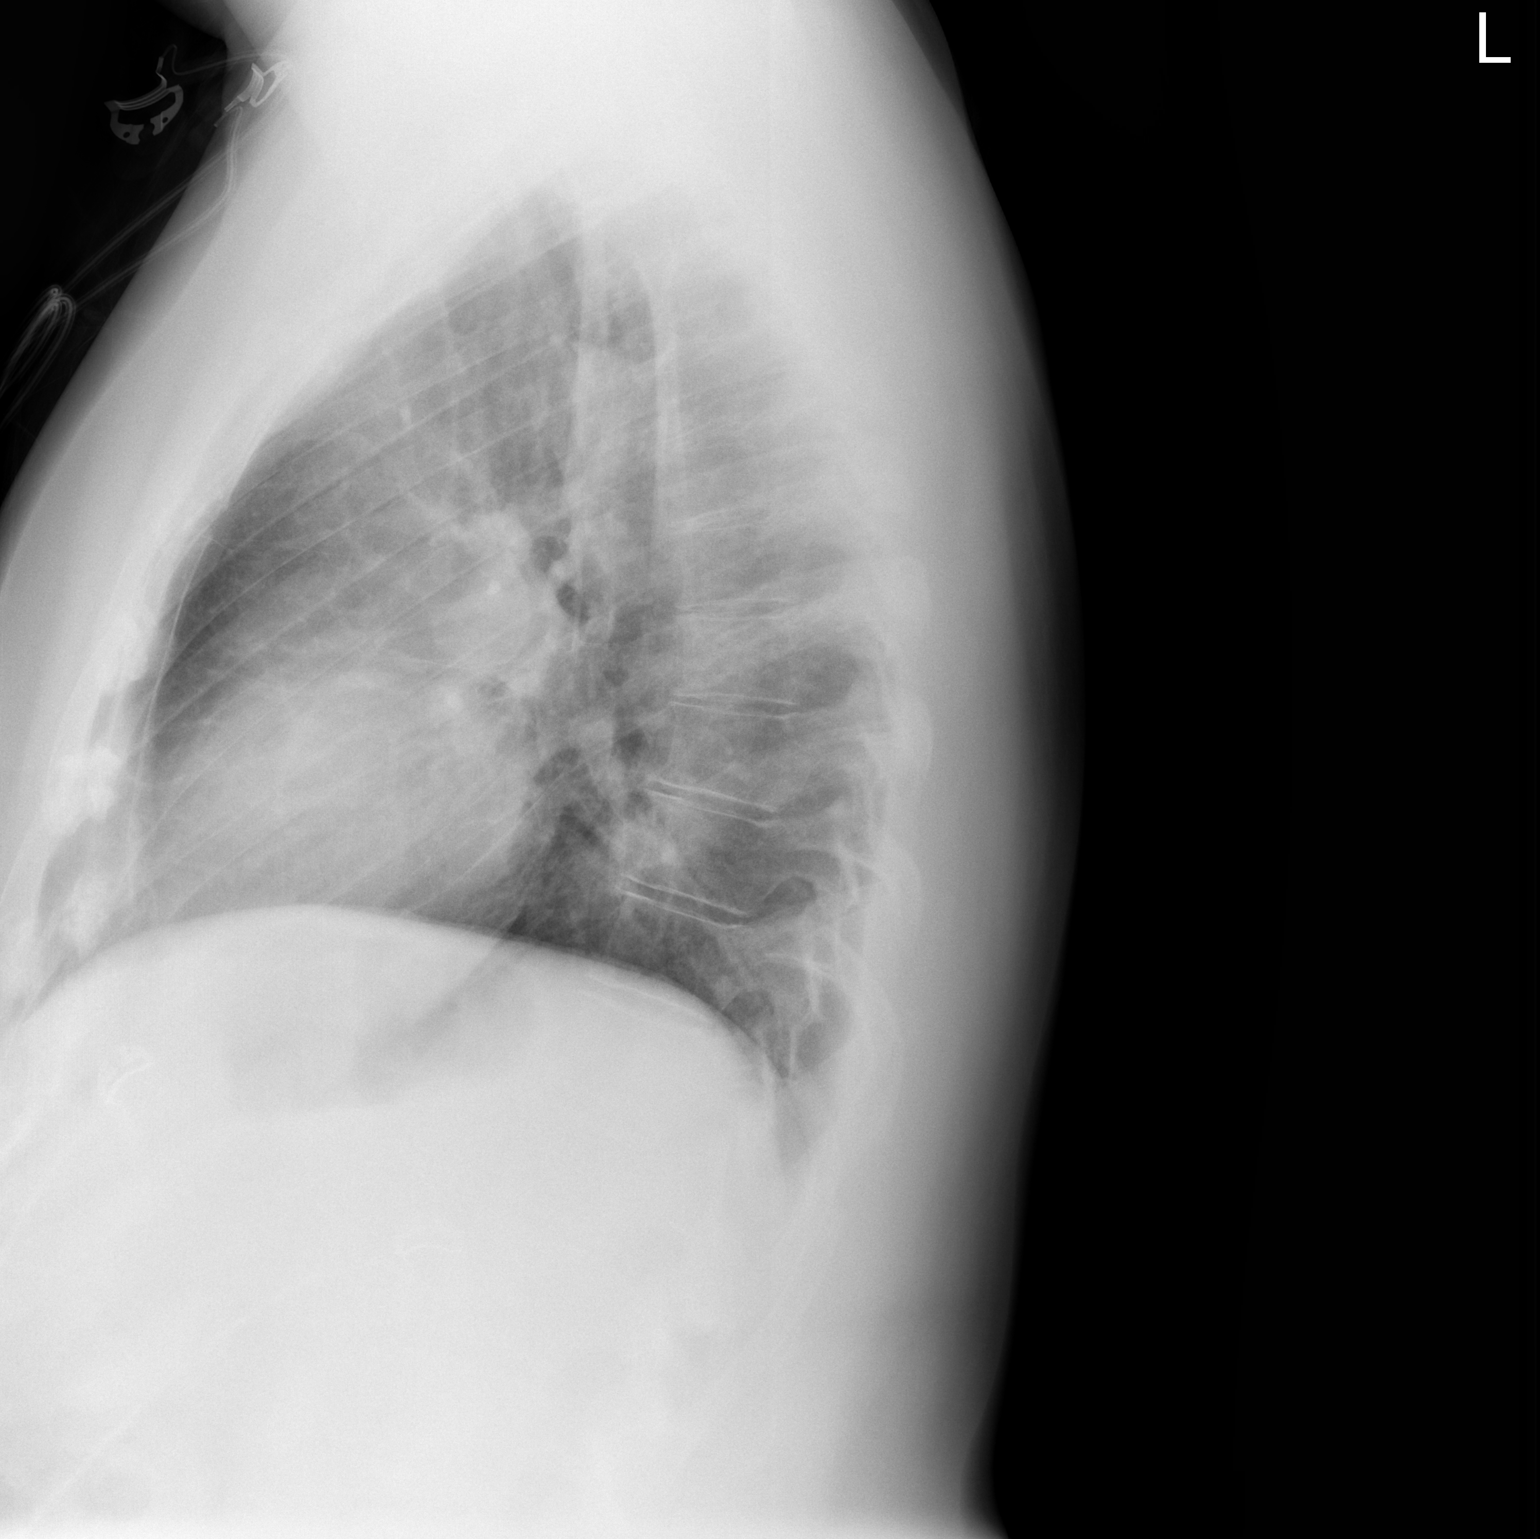

[2 of 2 positions shown; findings below may reference images not displayed]

FINDINGS: The lungs are adequately inflated. There is no focal infiltrate.
There are increased linear lung markings anteriorly and laterally at
the left lung base. There is no pleural effusion or pneumothorax.
The heart and pulmonary vascularity are normal. The mediastinum is
normal in width. The bony thorax exhibits no acute abnormality.
IMPRESSION: Subsegmental atelectasis or scarring in the lingula. No alveolar
pneumonia nor CHF.

Follow-up radiographs following anticipated antibiotic therapy are
recommended. If no therapy is planned, a follow-up chest x-ray is
recommended unless the patient's symptoms completely resolve.

## 2018-11-01 DIAGNOSIS — F4321 Adjustment disorder with depressed mood: Secondary | ICD-10-CM | POA: Diagnosis not present

## 2018-11-09 ENCOUNTER — Other Ambulatory Visit: Payer: Self-pay | Admitting: Family Medicine

## 2018-11-09 DIAGNOSIS — I10 Essential (primary) hypertension: Secondary | ICD-10-CM

## 2019-01-01 DIAGNOSIS — F4321 Adjustment disorder with depressed mood: Secondary | ICD-10-CM | POA: Diagnosis not present

## 2019-03-04 DIAGNOSIS — F4321 Adjustment disorder with depressed mood: Secondary | ICD-10-CM | POA: Diagnosis not present

## 2019-03-19 DIAGNOSIS — F4321 Adjustment disorder with depressed mood: Secondary | ICD-10-CM | POA: Diagnosis not present

## 2019-04-01 DIAGNOSIS — F4321 Adjustment disorder with depressed mood: Secondary | ICD-10-CM | POA: Diagnosis not present

## 2019-04-03 ENCOUNTER — Other Ambulatory Visit: Payer: Self-pay

## 2019-04-05 ENCOUNTER — Encounter: Payer: Self-pay | Admitting: Family Medicine

## 2019-04-05 ENCOUNTER — Other Ambulatory Visit: Payer: Self-pay

## 2019-04-05 ENCOUNTER — Ambulatory Visit: Payer: BC Managed Care – PPO | Admitting: Family Medicine

## 2019-04-05 VITALS — BP 110/82 | HR 89 | Temp 98.4°F | Resp 18 | Wt 262.2 lb

## 2019-04-05 DIAGNOSIS — I10 Essential (primary) hypertension: Secondary | ICD-10-CM | POA: Diagnosis not present

## 2019-04-05 LAB — BASIC METABOLIC PANEL
BUN: 24 mg/dL — ABNORMAL HIGH (ref 6–23)
CO2: 30 mEq/L (ref 19–32)
Calcium: 9.5 mg/dL (ref 8.4–10.5)
Chloride: 100 mEq/L (ref 96–112)
Creatinine, Ser: 1.12 mg/dL (ref 0.40–1.50)
GFR: 70.34 mL/min (ref >60.00)
Glucose, Bld: 94 mg/dL (ref 70–99)
Potassium: 3.7 mEq/L (ref 3.5–5.1)
Sodium: 139 mEq/L (ref 135–145)

## 2019-04-05 MED ORDER — METOPROLOL SUCCINATE ER 25 MG PO TB24: ORAL_TABLET | ORAL | 3 refills | Status: DC

## 2019-04-05 MED ORDER — CHLORTHALIDONE 25 MG PO TABS: 25.0000 mg | ORAL_TABLET | Freq: Every day | ORAL | 3 refills | Status: DC

## 2019-04-05 NOTE — Patient Instructions (Addendum)
Keep the diet clean and stay active.  Give [place] 2-3 business days to get the results of your labs back.   Take BP around 1x/week to see if it starts trending down. If it does, let me know.   The only lifestyle changes that have data behind them are weight loss for the overweight/obese and elevating the head of the bed. Finding out which foods/positions are triggers is important.  Consider Pepcid (famotidine) 20 mg as needed.  Let us know if you need anything.

## 2019-04-05 NOTE — Progress Notes (Signed)
Chief Complaint  Patient presents with  . Follow-up    Blood pressure, going well     Subjective Kevin Newman is a 49 y.o. male who presents for hypertension follow up. He does monitor home blood pressures. Blood pressures ranging from 110-120's/70's on average. He is compliant with medications-chlorthalidone 25 mg daily with 20 mEq of potassium, Toprol-XL 25 mg daily, amlodipine 5 mg daily. Patient has these side effects of medication: none He is sinetunes adhering to a healthy diet overall. Current exercise: some walking   Past Medical History:  Diagnosis Date  . Anxiety   . Diverticulosis   . Essential hypertension 04/22/2016  . Hypogonadism male 08/18/2016  . Internal hemorrhoids   . Sleep apnea    boderline, have CPAP    Review of Systems Cardiovascular: no chest pain Respiratory:  no shortness of breath  Exam BP 110/82 (BP Location: Left Arm, Patient Position: Sitting, Cuff Size: Normal)   Pulse 89   Temp 98.4 F (36.9 C) (Temporal)   Resp 18   Wt 262 lb 3.2 oz (118.9 kg)   SpO2 97%   BMI 39.87 kg/m  General:  well developed, well nourished, in no apparent distress Heart: RRR, no bruits, no LE edema Lungs: clear to auscultation, no accessory muscle use Psych: well oriented with normal range of affect and appropriate judgment/insight  Essential hypertension - Plan: chlorthalidone (HYGROTON) 25 MG tablet, Basic metabolic panel  Morbid obesity (HCC)  Cont meds.  Counseled on diet and exercise. Monitor BP around 1x/week, if trending down, he will notify us.  F/u in 6 mo for CPE. The patient voiced understanding and agreement to the plan.  [place], DO 04/05/19  9:17 AM

## 2019-04-30 DIAGNOSIS — D225 Melanocytic nevi of trunk: Secondary | ICD-10-CM | POA: Diagnosis not present

## 2019-04-30 DIAGNOSIS — L219 Seborrheic dermatitis, unspecified: Secondary | ICD-10-CM | POA: Diagnosis not present

## 2019-04-30 DIAGNOSIS — L570 Actinic keratosis: Secondary | ICD-10-CM | POA: Diagnosis not present

## 2019-04-30 DIAGNOSIS — L578 Other skin changes due to chronic exposure to nonionizing radiation: Secondary | ICD-10-CM | POA: Diagnosis not present

## 2019-04-30 DIAGNOSIS — L304 Erythema intertrigo: Secondary | ICD-10-CM | POA: Diagnosis not present

## 2019-04-30 DIAGNOSIS — B078 Other viral warts: Secondary | ICD-10-CM | POA: Diagnosis not present

## 2019-05-24 DIAGNOSIS — F4321 Adjustment disorder with depressed mood: Secondary | ICD-10-CM | POA: Diagnosis not present

## 2019-06-17 DIAGNOSIS — F4321 Adjustment disorder with depressed mood: Secondary | ICD-10-CM | POA: Diagnosis not present

## 2019-06-25 MED ORDER — POTASSIUM CHLORIDE CRYS ER 10 MEQ PO TBCR: 20.0000 meq | EXTENDED_RELEASE_TABLET | Freq: Every day | ORAL | 0 refills | Status: DC

## 2019-07-16 DIAGNOSIS — F4321 Adjustment disorder with depressed mood: Secondary | ICD-10-CM | POA: Diagnosis not present

## 2019-09-10 DIAGNOSIS — F4321 Adjustment disorder with depressed mood: Secondary | ICD-10-CM | POA: Diagnosis not present

## 2019-09-16 MED ORDER — POTASSIUM CHLORIDE CRYS ER 10 MEQ PO TBCR: 20.0000 meq | EXTENDED_RELEASE_TABLET | Freq: Every day | ORAL | 1 refills | Status: DC

## 2019-10-01 ENCOUNTER — Encounter: Payer: Self-pay | Admitting: Family Medicine

## 2019-10-01 ENCOUNTER — Other Ambulatory Visit: Payer: Self-pay

## 2019-10-01 ENCOUNTER — Other Ambulatory Visit: Payer: Self-pay | Admitting: Family Medicine

## 2019-10-01 ENCOUNTER — Ambulatory Visit (INDEPENDENT_AMBULATORY_CARE_PROVIDER_SITE_OTHER): Payer: BC Managed Care – PPO | Admitting: Family Medicine

## 2019-10-01 VITALS — BP 118/72 | HR 84 | Temp 98.0°F | Ht 68.0 in | Wt 276.2 lb

## 2019-10-01 DIAGNOSIS — Z125 Encounter for screening for malignant neoplasm of prostate: Secondary | ICD-10-CM | POA: Diagnosis not present

## 2019-10-01 DIAGNOSIS — Z0000 Encounter for general adult medical examination without abnormal findings: Secondary | ICD-10-CM

## 2019-10-01 DIAGNOSIS — E785 Hyperlipidemia, unspecified: Secondary | ICD-10-CM

## 2019-10-01 DIAGNOSIS — E876 Hypokalemia: Secondary | ICD-10-CM

## 2019-10-01 DIAGNOSIS — Z1159 Encounter for screening for other viral diseases: Secondary | ICD-10-CM

## 2019-10-01 DIAGNOSIS — I10 Essential (primary) hypertension: Secondary | ICD-10-CM

## 2019-10-01 LAB — LIPID PANEL
Cholesterol: 166 mg/dL (ref 0–200)
HDL: 45.8 mg/dL (ref >39.00)
NonHDL: 120.28
Total CHOL/HDL Ratio: 4
Triglycerides: 238 mg/dL — ABNORMAL HIGH (ref 0.0–149.0)
VLDL: 47.6 mg/dL — ABNORMAL HIGH (ref 0.0–40.0)

## 2019-10-01 LAB — CBC
HCT: 46.1 % (ref 39.0–52.0)
Hemoglobin: 15.7 g/dL (ref 13.0–17.0)
MCHC: 34 g/dL (ref 30.0–36.0)
MCV: 94.9 fl (ref 78.0–100.0)
Platelets: 214 10*3/uL (ref 150.0–400.0)
RBC: 4.86 Mil/uL (ref 4.22–5.81)
RDW: 12.7 % (ref 11.5–15.5)
WBC: 5.9 10*3/uL (ref 4.0–10.5)

## 2019-10-01 LAB — COMPREHENSIVE METABOLIC PANEL
ALT: 10 U/L (ref 0–53)
AST: 19 U/L (ref 0–37)
Albumin: 4.3 g/dL (ref 3.5–5.2)
Alkaline Phosphatase: 62 U/L (ref 39–117)
BUN: 19 mg/dL (ref 6–23)
CO2: 32 mEq/L (ref 19–32)
Calcium: 9.4 mg/dL (ref 8.4–10.5)
Chloride: 100 mEq/L (ref 96–112)
Creatinine, Ser: 0.99 mg/dL (ref 0.40–1.50)
GFR: 80.94 mL/min (ref >60.00)
Glucose, Bld: 91 mg/dL (ref 70–99)
Potassium: 3.6 mEq/L (ref 3.5–5.1)
Sodium: 138 mEq/L (ref 135–145)
Total Bilirubin: 1.3 mg/dL — ABNORMAL HIGH (ref 0.2–1.2)
Total Protein: 6.6 g/dL (ref 6.0–8.3)

## 2019-10-01 LAB — LDL CHOLESTEROL, DIRECT: Direct LDL: 90 mg/dL

## 2019-10-01 LAB — PSA: PSA: 0.46 ng/mL (ref 0.10–4.00)

## 2019-10-01 LAB — TSH: TSH: 1.79 u[IU]/mL (ref 0.35–4.50)

## 2019-10-01 LAB — T4, FREE: Free T4: 0.86 ng/dL (ref 0.60–1.60)

## 2019-10-01 MED ORDER — AMLODIPINE BESYLATE 5 MG PO TABS: 5.0000 mg | ORAL_TABLET | Freq: Every day | ORAL | 3 refills | Status: DC

## 2019-10-01 NOTE — Progress Notes (Signed)
Chief Complaint  Patient presents with  . Annual Exam    Well Male Kevin Newman is here for a complete physical.   His last physical was >1 year ago.  Current diet: in general, a "pretty good" diet.   Current exercise: walking Weight trend: up a couple lbs Fatigue out of ordinary? Yes. Seat belt? Yes.    Health maintenance Tetanus- Yes HIV- Yes Hep C- No  Past Medical History:  Diagnosis Date  . Anxiety   . Diverticulosis   . Essential hypertension 04/22/2016  . Hypogonadism male 08/18/2016  . Internal hemorrhoids   . Sleep apnea    boderline, have CPAP     Past Surgical History:  Procedure Laterality Date  . NO PAST SURGERIES      Medications  Current Outpatient Medications on File Prior to Visit  Medication Sig Dispense Refill  . chlorthalidone (HYGROTON) 25 MG tablet Take 1 tablet (25 mg total) by mouth daily. 90 tablet 3  . Cholecalciferol (VITAMIN D-3 PO) Take by mouth.    . loratadine (CLARITIN) 10 MG tablet     . Magnesium 400 MG TABS Take 400 mg by mouth 2 (two) times daily.    . metoprolol succinate (TOPROL-XL) 25 MG 24 hr tablet TAKE 1 TABLET(25 MG) BY MOUTH DAILY 90 tablet 3  . Multiple Vitamins-Minerals (MULTIVITAMIN ADULT PO) Take by mouth.    Marland Kitchen OVER THE COUNTER MEDICATION BIOTIC DETOXIFICATION SUPPORT.  Take 1 tablet daily    . potassium chloride (KLOR-CON) 10 MEQ tablet Take 2 tablets (20 mEq total) by mouth daily. 180 tablet 1  . vitamin E 1000 UNIT capsule Take 1,000 Units by mouth daily.      Allergies Allergies  Allergen Reactions  . Amoxil [Amoxicillin]   . Sulfa Antibiotics     Family History Family History  Problem Relation Age of Onset  . Heart disease Maternal Grandmother 80  . Thyroid disease Mother   . Gallbladder disease Mother   . Gallbladder disease Father   . Heart Problems Father   . Heart attack Maternal Grandfather   . Stroke Maternal Grandfather   . Breast cancer Paternal Grandmother   . Prostate cancer Paternal  Grandfather   . Cancer Maternal Uncle        type unknown    Review of Systems: Constitutional: no fevers or chills Eye:  no recent significant change in vision Ear/Nose/Mouth/Throat:  Ears:  no hearing loss Nose/Mouth/Throat:  no complaints of nasal congestion, no sore throat Cardiovascular:  no chest pain Respiratory:  no shortness of breath Gastrointestinal:  no abdominal pain, no change in bowel habits GU:  Male: negative for dysuria, frequency, and incontinence Musculoskeletal/Extremities:  no pain of the joints Integumentary (Skin/Breast):  no abnormal skin lesions reported Neurologic:  no headaches Endocrine: No unexpected weight changes Hematologic/Lymphatic:  no night sweats  Exam BP 118/72 (BP Location: Left Arm, Patient Position: Sitting, Cuff Size: Large)   Pulse 84   Temp 98 F (36.7 C) (Oral)   Ht 5\' 8"  (1.727 m)   Wt 276 lb 4 oz (125.3 kg)   SpO2 97%   BMI 42.00 kg/m  General:  well developed, well nourished, in no apparent distress Skin:  no significant moles, warts, or growths Head:  no masses, lesions, or tenderness Eyes:  pupils equal and round, sclera anicteric without injection Ears:  canals without lesions, TMs shiny without retraction, no obvious effusion, no erythema Nose:  nares patent, septum midline, mucosa normal Throat/Pharynx:  lips and  gingiva without lesion; tongue and uvula midline; non-inflamed pharynx; no exudates or postnasal drainage Neck: neck supple without adenopathy, thyromegaly, or masses Lungs:  clear to auscultation, breath sounds equal bilaterally, no respiratory distress Cardio:  regular rate and rhythm, no bruits, no LE edema Abdomen:  abdomen soft, nontender; bowel sounds normal; no masses or organomegaly Rectal: Deferred Musculoskeletal:  symmetrical muscle groups noted without atrophy or deformity Extremities:  no clubbing, cyanosis, or edema, no deformities, no skin discoloration Neuro:  gait normal; deep tendon reflexes  normal and symmetric Psych: well oriented with normal range of affect and appropriate judgment/insight  Assessment and Plan  Well adult exam - Plan: CBC, Comprehensive metabolic panel, TSH, T4, free, Lipid panel  Essential hypertension - Plan: amLODipine (NORVASC) 5 MG tablet  Encounter for hepatitis C screening test for low risk patient - Plan: Hepatitis C antibody  Screening for prostate cancer - Plan: PSA   Well 49 y.o. male. Counseled on diet and exercise. Counseled on risks and benefits of prostate cancer screening with PSA. The patient agrees to undergo screening. Rec'd getting the covid vaccination.   Other orders as above. Follow up in 6 mo pending the above workup. The patient voiced understanding and agreement to the plan.  [place], DO 10/01/19 9:16 AM

## 2019-10-01 NOTE — Patient Instructions (Addendum)
Give [place] 2-3 business days to get the results of your labs back.   Keep the diet clean and stay active.  Try to get 7-9 hours of sleep nightly.   I recommend the covid vaccination- Pfizer or Levan Hurst is recommended.   Let us know if you need anything.

## 2019-10-02 LAB — HEPATITIS C ANTIBODY
Hepatitis C Ab: NONREACTIVE
SIGNAL TO CUT-OFF: 0 (ref <1.00)

## 2019-10-11 DIAGNOSIS — F4321 Adjustment disorder with depressed mood: Secondary | ICD-10-CM | POA: Diagnosis not present

## 2019-11-26 NOTE — Addendum Note (Signed)
Addended by: Kelle Darting A on: 11/26/2019 09:51 AM   Modules accepted: Orders

## 2019-11-27 ENCOUNTER — Other Ambulatory Visit (INDEPENDENT_AMBULATORY_CARE_PROVIDER_SITE_OTHER): Payer: BC Managed Care – PPO

## 2019-11-27 ENCOUNTER — Other Ambulatory Visit: Payer: Self-pay

## 2019-11-27 DIAGNOSIS — E785 Hyperlipidemia, unspecified: Secondary | ICD-10-CM | POA: Diagnosis not present

## 2019-11-28 LAB — LIPID PANEL
Cholesterol: 184 mg/dL (ref <200)
HDL: 53 mg/dL (ref >=40)
LDL Cholesterol (Calc): 98 mg/dL (calc)
Non-HDL Cholesterol (Calc): 131 mg/dL (calc) — ABNORMAL HIGH (ref <130)
Total CHOL/HDL Ratio: 3.5 (calc) (ref <5.0)
Triglycerides: 211 mg/dL — ABNORMAL HIGH (ref <150)

## 2019-11-29 ENCOUNTER — Other Ambulatory Visit: Payer: Self-pay | Admitting: Family Medicine

## 2019-11-29 DIAGNOSIS — E785 Hyperlipidemia, unspecified: Secondary | ICD-10-CM

## 2019-11-29 MED ORDER — ROSUVASTATIN CALCIUM 10 MG PO TABS: 10.0000 mg | ORAL_TABLET | Freq: Every day | ORAL | 3 refills | Status: DC

## 2019-11-29 NOTE — Telephone Encounter (Signed)
Sent. Please schedule a lab visit in 6 weeks and order a lipid panel and hepatic function panel. Thank you.

## 2020-01-04 DIAGNOSIS — F4321 Adjustment disorder with depressed mood: Secondary | ICD-10-CM | POA: Diagnosis not present

## 2020-01-28 ENCOUNTER — Other Ambulatory Visit: Payer: Self-pay | Admitting: Family Medicine

## 2020-01-28 DIAGNOSIS — E876 Hypokalemia: Secondary | ICD-10-CM

## 2020-01-28 DIAGNOSIS — E785 Hyperlipidemia, unspecified: Secondary | ICD-10-CM

## 2020-02-05 ENCOUNTER — Other Ambulatory Visit (INDEPENDENT_AMBULATORY_CARE_PROVIDER_SITE_OTHER): Payer: BC Managed Care – PPO

## 2020-02-05 ENCOUNTER — Other Ambulatory Visit: Payer: Self-pay

## 2020-02-05 DIAGNOSIS — E876 Hypokalemia: Secondary | ICD-10-CM | POA: Diagnosis not present

## 2020-02-05 DIAGNOSIS — E785 Hyperlipidemia, unspecified: Secondary | ICD-10-CM | POA: Diagnosis not present

## 2020-02-05 NOTE — Addendum Note (Signed)
Addended by: Kelle Darting A on: 02/05/2020 10:21 AM   Modules accepted: Orders

## 2020-02-06 LAB — COMPREHENSIVE METABOLIC PANEL
ALT: 13 U/L (ref 0–53)
AST: 23 U/L (ref 0–37)
Albumin: 4.5 g/dL (ref 3.5–5.2)
Alkaline Phosphatase: 76 U/L (ref 39–117)
BUN: 24 mg/dL — ABNORMAL HIGH (ref 6–23)
CO2: 33 mEq/L — ABNORMAL HIGH (ref 19–32)
Calcium: 9.6 mg/dL (ref 8.4–10.5)
Chloride: 100 mEq/L (ref 96–112)
Creatinine, Ser: 1.06 mg/dL (ref 0.40–1.50)
GFR: 83.51 mL/min (ref >60.00)
Glucose, Bld: 74 mg/dL (ref 70–99)
Potassium: 3.7 mEq/L (ref 3.5–5.1)
Sodium: 141 mEq/L (ref 135–145)
Total Bilirubin: 1.8 mg/dL — ABNORMAL HIGH (ref 0.2–1.2)
Total Protein: 7 g/dL (ref 6.0–8.3)

## 2020-02-06 LAB — LIPID PANEL
Cholesterol: 136 mg/dL (ref 0–200)
HDL: 49.9 mg/dL (ref >39.00)
NonHDL: 85.72
Total CHOL/HDL Ratio: 3
Triglycerides: 258 mg/dL — ABNORMAL HIGH (ref 0.0–149.0)
VLDL: 51.6 mg/dL — ABNORMAL HIGH (ref 0.0–40.0)

## 2020-02-06 LAB — LDL CHOLESTEROL, DIRECT: Direct LDL: 67 mg/dL

## 2020-02-06 MED ORDER — FENOFIBRATE 48 MG PO TABS: 48.0000 mg | ORAL_TABLET | Freq: Every day | ORAL | 3 refills | Status: DC

## 2020-02-06 NOTE — Telephone Encounter (Signed)
Plz sched lab visit in 6 weeks. Order lipid panel plz. Ty.

## 2020-02-07 ENCOUNTER — Other Ambulatory Visit: Payer: Self-pay | Admitting: Family Medicine

## 2020-02-07 DIAGNOSIS — E785 Hyperlipidemia, unspecified: Secondary | ICD-10-CM

## 2020-02-13 DIAGNOSIS — F4321 Adjustment disorder with depressed mood: Secondary | ICD-10-CM | POA: Diagnosis not present

## 2020-02-17 ENCOUNTER — Other Ambulatory Visit: Payer: Self-pay | Admitting: Family Medicine

## 2020-02-17 DIAGNOSIS — I10 Essential (primary) hypertension: Secondary | ICD-10-CM

## 2020-02-17 MED ORDER — AMLODIPINE BESYLATE 5 MG PO TABS: 5.0000 mg | ORAL_TABLET | Freq: Every day | ORAL | 3 refills | Status: DC

## 2020-03-10 ENCOUNTER — Other Ambulatory Visit: Payer: Self-pay | Admitting: Family Medicine

## 2020-03-19 DIAGNOSIS — F4321 Adjustment disorder with depressed mood: Secondary | ICD-10-CM | POA: Diagnosis not present

## 2020-03-20 ENCOUNTER — Other Ambulatory Visit: Payer: BC Managed Care – PPO

## 2020-03-25 ENCOUNTER — Other Ambulatory Visit: Payer: Self-pay

## 2020-03-25 ENCOUNTER — Other Ambulatory Visit (INDEPENDENT_AMBULATORY_CARE_PROVIDER_SITE_OTHER): Payer: BC Managed Care – PPO

## 2020-03-25 DIAGNOSIS — E785 Hyperlipidemia, unspecified: Secondary | ICD-10-CM | POA: Diagnosis not present

## 2020-03-25 LAB — LIPID PANEL
Cholesterol: 135 mg/dL (ref 0–200)
HDL: 49.9 mg/dL (ref >39.00)
LDL Cholesterol: 54 mg/dL (ref 0–99)
NonHDL: 84.78
Total CHOL/HDL Ratio: 3
Triglycerides: 154 mg/dL — ABNORMAL HIGH (ref 0.0–149.0)
VLDL: 30.8 mg/dL (ref 0.0–40.0)

## 2020-03-26 ENCOUNTER — Other Ambulatory Visit: Payer: Self-pay

## 2020-03-26 MED ORDER — ROSUVASTATIN CALCIUM 10 MG PO TABS
10.0000 mg | ORAL_TABLET | Freq: Every day | ORAL | 1 refills | Status: DC
Start: 2020-03-26 — End: 2020-09-21

## 2020-04-01 ENCOUNTER — Other Ambulatory Visit: Payer: Self-pay

## 2020-04-01 ENCOUNTER — Ambulatory Visit: Payer: BC Managed Care – PPO | Admitting: Family Medicine

## 2020-04-01 ENCOUNTER — Encounter: Payer: Self-pay | Admitting: Family Medicine

## 2020-04-01 ENCOUNTER — Other Ambulatory Visit: Payer: Self-pay | Admitting: Family Medicine

## 2020-04-01 VITALS — BP 108/82 | HR 87 | Temp 98.2°F | Ht 68.0 in | Wt 282.0 lb

## 2020-04-01 DIAGNOSIS — Z23 Encounter for immunization: Secondary | ICD-10-CM | POA: Diagnosis not present

## 2020-04-01 DIAGNOSIS — I10 Essential (primary) hypertension: Secondary | ICD-10-CM

## 2020-04-01 DIAGNOSIS — Z3141 Encounter for fertility testing: Secondary | ICD-10-CM

## 2020-04-01 DIAGNOSIS — E782 Mixed hyperlipidemia: Secondary | ICD-10-CM

## 2020-04-01 MED ORDER — FENOFIBRATE 48 MG PO TABS: 48.0000 mg | ORAL_TABLET | Freq: Every day | ORAL | 2 refills | Status: DC

## 2020-04-01 NOTE — Progress Notes (Signed)
Chief Complaint  Patient presents with  . Follow-up    Subjective Kevin Newman is a 50 y.o. male who presents for hypertension follow up. He does monitor home blood pressures. Blood pressures ranging from 110's/80's on average. He is compliant with medications- Norvasc 5 mg/d, chlorthalidone 25 mg/d, Toprol XL 25 mg/d. Patient has these side effects of medication: none He is sometimes adhering to a healthy diet overall. Current exercise: walking No CP or SOB.  Hyperlipidemia Patient presents for dyslipidemia follow up. Currently being treated with fenofibrate 48 mg/d, Crestor 10 mg/d and compliance with treatment thus far has been good. He denies myalgias. Diet/exercise as above.  The patient is not known to have coexisting coronary artery disease.   Past Medical History:  Diagnosis Date  . Anxiety   . Diverticulosis   . Essential hypertension 04/22/2016  . Hypogonadism male 08/18/2016  . Internal hemorrhoids   . Sleep apnea    boderline, have CPAP   Exam BP 108/82 (BP Location: Left Arm, Patient Position: Sitting, Cuff Size: Large)   Pulse 87   Temp 98.2 F (36.8 C) (Oral)   Ht 5\' 8"  (1.727 m)   Wt 282 lb (127.9 kg)   SpO2 99%   BMI 42.88 kg/m  General:  well developed, well nourished, in no apparent distress Heart: RRR, no bruits, no LE edema Lungs: clear to auscultation, no accessory muscle use Psych: well oriented with normal range of affect and appropriate judgment/insight  Essential hypertension  Mixed hyperlipidemia - Plan: fenofibrate (TRICOR) 48 MG tablet  Morbid obesity (HCC)  Need for influenza vaccination - Plan: Flu Vaccine QUAD 6+ mos PF IM (Fluarix Quad PF)  1. Cont Norvasc 5 mg/d, chlorthalidone 25 mg/d, Toprol XL 25 mg/d. OK to ck BP prn. Counseled on diet and exercise. 2. Cont Tricor 48 mg/d, Crestor 10 mg/d 3. Refer [facility]. Pt has been working on his diet.  F/u in 6 mo for CPE. The patient voiced understanding and agreement to the  plan.  [place], DO 04/01/20  8:19 AM

## 2020-04-01 NOTE — Patient Instructions (Addendum)
Keep the diet clean and stay active.  Aim to do some physical exertion for 150 minutes per week. This is typically divided into 5 days per week, 30 minutes per day. The activity should be enough to get your heart rate up. Anything is better than nothing if you have time constraints.  Because your blood pressure is well-controlled, you no longer have to check your blood pressure at home anymore unless you wish. Some people check it twice daily every day and some people stop altogether. Either or anything in between is fine. Strong work!  If you do not hear anything about your referral (and D's) in the next 1-2 weeks, call our office and ask for an update.  Let us know if you need anything.

## 2020-04-03 ENCOUNTER — Ambulatory Visit: Payer: BC Managed Care – PPO | Admitting: Family Medicine

## 2020-04-09 DIAGNOSIS — F4321 Adjustment disorder with depressed mood: Secondary | ICD-10-CM | POA: Diagnosis not present

## 2020-04-29 DIAGNOSIS — L304 Erythema intertrigo: Secondary | ICD-10-CM | POA: Diagnosis not present

## 2020-04-29 DIAGNOSIS — D2271 Melanocytic nevi of right lower limb, including hip: Secondary | ICD-10-CM | POA: Diagnosis not present

## 2020-04-29 DIAGNOSIS — L578 Other skin changes due to chronic exposure to nonionizing radiation: Secondary | ICD-10-CM | POA: Diagnosis not present

## 2020-04-29 DIAGNOSIS — D225 Melanocytic nevi of trunk: Secondary | ICD-10-CM | POA: Diagnosis not present

## 2020-05-08 DIAGNOSIS — F4321 Adjustment disorder with depressed mood: Secondary | ICD-10-CM | POA: Diagnosis not present

## 2020-05-12 DIAGNOSIS — Z3141 Encounter for fertility testing: Secondary | ICD-10-CM | POA: Diagnosis not present

## 2020-05-28 ENCOUNTER — Other Ambulatory Visit: Payer: Self-pay | Admitting: Family Medicine

## 2020-06-03 ENCOUNTER — Other Ambulatory Visit: Payer: Self-pay | Admitting: Family Medicine

## 2020-06-03 DIAGNOSIS — I10 Essential (primary) hypertension: Secondary | ICD-10-CM

## 2020-06-05 DIAGNOSIS — F4321 Adjustment disorder with depressed mood: Secondary | ICD-10-CM | POA: Diagnosis not present

## 2020-06-18 NOTE — Telephone Encounter (Signed)
Close encounter 

## 2020-07-06 DIAGNOSIS — F4321 Adjustment disorder with depressed mood: Secondary | ICD-10-CM | POA: Diagnosis not present

## 2020-08-13 DIAGNOSIS — F4321 Adjustment disorder with depressed mood: Secondary | ICD-10-CM | POA: Diagnosis not present

## 2020-09-03 ENCOUNTER — Ambulatory Visit (INDEPENDENT_AMBULATORY_CARE_PROVIDER_SITE_OTHER): Payer: BC Managed Care – PPO | Admitting: Family Medicine

## 2020-09-03 ENCOUNTER — Encounter (INDEPENDENT_AMBULATORY_CARE_PROVIDER_SITE_OTHER): Payer: Self-pay | Admitting: Family Medicine

## 2020-09-03 ENCOUNTER — Other Ambulatory Visit: Payer: Self-pay

## 2020-09-03 VITALS — BP 114/76 | HR 76 | Temp 98.1°F | Ht 68.0 in | Wt 289.0 lb

## 2020-09-03 DIAGNOSIS — E65 Localized adiposity: Secondary | ICD-10-CM

## 2020-09-03 DIAGNOSIS — R0602 Shortness of breath: Secondary | ICD-10-CM

## 2020-09-03 DIAGNOSIS — R5383 Other fatigue: Secondary | ICD-10-CM | POA: Diagnosis not present

## 2020-09-03 DIAGNOSIS — R6889 Other general symptoms and signs: Secondary | ICD-10-CM | POA: Diagnosis not present

## 2020-09-03 DIAGNOSIS — E66813 Obesity, class 3: Secondary | ICD-10-CM

## 2020-09-03 DIAGNOSIS — Z6841 Body Mass Index (BMI) 40.0 and over, adult: Secondary | ICD-10-CM

## 2020-09-03 DIAGNOSIS — E782 Mixed hyperlipidemia: Secondary | ICD-10-CM

## 2020-09-03 DIAGNOSIS — Z9189 Other specified personal risk factors, not elsewhere classified: Secondary | ICD-10-CM | POA: Diagnosis not present

## 2020-09-03 DIAGNOSIS — I10 Essential (primary) hypertension: Secondary | ICD-10-CM | POA: Diagnosis not present

## 2020-09-03 DIAGNOSIS — G4733 Obstructive sleep apnea (adult) (pediatric): Secondary | ICD-10-CM

## 2020-09-03 DIAGNOSIS — Z1331 Encounter for screening for depression: Secondary | ICD-10-CM | POA: Diagnosis not present

## 2020-09-03 DIAGNOSIS — E7849 Other hyperlipidemia: Secondary | ICD-10-CM | POA: Diagnosis not present

## 2020-09-03 DIAGNOSIS — Z0289 Encounter for other administrative examinations: Secondary | ICD-10-CM

## 2020-09-04 LAB — CBC WITH DIFFERENTIAL/PLATELET
Basophils Absolute: 0 10*3/uL (ref 0.0–0.2)
Basos: 1 %
EOS (ABSOLUTE): 0.1 10*3/uL (ref 0.0–0.4)
Eos: 2 %
Hematocrit: 46 % (ref 37.5–51.0)
Hemoglobin: 15.4 g/dL (ref 13.0–17.7)
Immature Grans (Abs): 0 10*3/uL (ref 0.0–0.1)
Immature Granulocytes: 0 %
Lymphocytes Absolute: 1.8 10*3/uL (ref 0.7–3.1)
Lymphs: 26 %
MCH: 31.2 pg (ref 26.6–33.0)
MCHC: 33.5 g/dL (ref 31.5–35.7)
MCV: 93 fL (ref 79–97)
Monocytes Absolute: 0.5 10*3/uL (ref 0.1–0.9)
Monocytes: 8 %
Neutrophils Absolute: 4.3 10*3/uL (ref 1.4–7.0)
Neutrophils: 63 %
Platelets: 224 10*3/uL (ref 150–450)
RBC: 4.94 x10E6/uL (ref 4.14–5.80)
RDW: 12.1 % (ref 11.6–15.4)
WBC: 6.8 10*3/uL (ref 3.4–10.8)

## 2020-09-04 LAB — COMPREHENSIVE METABOLIC PANEL
ALT: 12 IU/L (ref 0–44)
AST: 25 IU/L (ref 0–40)
Albumin/Globulin Ratio: 2.1 (ref 1.2–2.2)
Albumin: 4.6 g/dL (ref 4.0–5.0)
Alkaline Phosphatase: 63 IU/L (ref 44–121)
BUN/Creatinine Ratio: 15 (ref 9–20)
BUN: 18 mg/dL (ref 6–24)
Bilirubin Total: 1 mg/dL (ref 0.0–1.2)
CO2: 26 mmol/L (ref 20–29)
Calcium: 9.3 mg/dL (ref 8.7–10.2)
Chloride: 97 mmol/L (ref 96–106)
Creatinine, Ser: 1.18 mg/dL (ref 0.76–1.27)
Globulin, Total: 2.2 g/dL (ref 1.5–4.5)
Glucose: 80 mg/dL (ref 65–99)
Potassium: 3.8 mmol/L (ref 3.5–5.2)
Sodium: 139 mmol/L (ref 134–144)
Total Protein: 6.8 g/dL (ref 6.0–8.5)
eGFR: 76 mL/min/{1.73_m2} (ref >59)

## 2020-09-04 LAB — LIPID PANEL WITH LDL/HDL RATIO
Cholesterol, Total: 141 mg/dL (ref 100–199)
HDL: 57 mg/dL (ref >39)
LDL Chol Calc (NIH): 59 mg/dL (ref 0–99)
LDL/HDL Ratio: 1 ratio (ref 0.0–3.6)
Triglycerides: 144 mg/dL (ref 0–149)
VLDL Cholesterol Cal: 25 mg/dL (ref 5–40)

## 2020-09-04 LAB — VITAMIN B12: Vitamin B-12: 582 pg/mL (ref 232–1245)

## 2020-09-04 LAB — VITAMIN D 25 HYDROXY (VIT D DEFICIENCY, FRACTURES): Vit D, 25-Hydroxy: 81.7 ng/mL (ref 30.0–100.0)

## 2020-09-04 LAB — INSULIN, RANDOM: INSULIN: 23.7 u[IU]/mL (ref 2.6–24.9)

## 2020-09-04 LAB — TSH: TSH: 2.32 u[IU]/mL (ref 0.450–4.500)

## 2020-09-04 LAB — HEMOGLOBIN A1C
Est. average glucose Bld gHb Est-mCnc: 111 mg/dL
Hgb A1c MFr Bld: 5.5 % (ref 4.8–5.6)

## 2020-09-04 LAB — T4, FREE: Free T4: 1.07 ng/dL (ref 0.82–1.77)

## 2020-09-04 LAB — T3: T3, Total: 140 ng/dL (ref 71–180)

## 2020-09-04 LAB — FOLATE: Folate: 15 ng/mL (ref >3.0)

## 2020-09-07 ENCOUNTER — Other Ambulatory Visit: Payer: Self-pay | Admitting: Family Medicine

## 2020-09-16 NOTE — Progress Notes (Signed)
Dear Dr. Nani Newman,   Thank you for referring Kevin Newman to our clinic. The following note includes my evaluation and treatment recommendations.  Chief Complaint:   OBESITY Kevin Newman (MR# [id number]) is a 50 y.o. male who presents for evaluation and treatment of obesity and related comorbidities. Current BMI is Body mass index is 43.94 kg/m. Kevin Newman has been struggling with his weight for many years and has been unsuccessful in either losing weight, maintaining weight loss, or reaching his healthy weight goal.  Kevin Newman is currently in the action stage of change and ready to dedicate time achieving and maintaining a healthier weight. Kevin Newman is interested in becoming our patient and working on intensive lifestyle modifications including (but not limited to) diet and exercise for weight loss.  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  He lives with his wife, Kevin Newman, who is also joining the program.  He eats out 3-5 times per week.  Snacks on chips, cereal, and salty/crunchy foods, especially after dinner.  A lot of caloric beverages - sweet tea or soda.  Kevin Newman habits were reviewed today and are as follows: His family eats meals together, he thinks his family will eat healthier with him, he struggles with family and or coworkers weight loss sabotage, his desired weight loss is 50 pounds, he has been heavy most of his life, he started gaining weight in high school, his heaviest weight ever was his current weight, he craves chips, salty/savory snacks, and peanut buttery, he snacks frequently in the evenings, he skips breakfast frequently, he is frequently drinking liquids with calories, he frequently makes poor food choices, he frequently eats larger portions than normal, and he struggles with emotional eating.  Depression Screen Kevin Food and Mood (modified PHQ-9) score was 8.  Depression screen PHQ 2/9 09/03/2020  Decreased Interest 1  Down, Depressed, Hopeless 0  PHQ  - 2 Score 1  Altered sleeping 3  Tired, decreased energy 3  Change in appetite 1  Feeling bad or failure about yourself  0  Trouble concentrating 0  Moving slowly or fidgety/restless 0  Suicidal thoughts 0  PHQ-9 Score 8   Assessment/Plan:   Orders Placed This Encounter  Procedures   Vitamin B12   Lipid Panel With LDL/HDL Ratio   T3   T4, free   TSH   VITAMIN D 25 Hydroxy (Vit-D Deficiency, Fractures)   Hemoglobin A1c   Insulin, random   CBC with Differential/Platelet   Comprehensive metabolic panel   Folate   EKG 12-Lead   1. Other fatigue Kevin Newman admits to daytime somnolence and reports waking up still tired. Patent has a history of symptoms of daytime fatigue, morning fatigue, and snoring. Kevin Newman gets 6 hours of sleep per night, and states that he has Newman restful sleep. Snoring is present. Apneic episodes are not present. Epworth Sleepiness Score is 18.    Kevin Newman does feel that his weight is causing his energy to be lower than it should be. Fatigue may be related to obesity, depression or many other causes. Labs will be ordered, and in the meanwhile, Kevin Newman will focus on self care including making healthy food choices, increasing physical activity and focusing on stress reduction.  Will check labs and EKG today.  - EKG 12-Lead - Vitamin B12 - T3 - T4, free - TSH - VITAMIN D 25 Hydroxy (Vit-D Deficiency, Fractures) - Hemoglobin A1c - Insulin, random - CBC with Differential/Platelet - Comprehensive metabolic panel - Folate  2. Shortness of breath on exertion Kevin Newman notes increasing shortness of breath with exercising and seems to be worsening over time with weight gain. He notes getting out of breath sooner with activity than he used to. This has gotten worse recently. Kevin Newman denies shortness of breath at rest or orthopnea.  Kevin Newman does feel that he gets out of breath more easily that he used to when he exercises. Kevin Newman shortness of breath appears to  be obesity related and exercise induced. He has agreed to work on weight loss and gradually increase exercise to treat his exercise induced shortness of breath. Will continue to monitor closely.  Will check labs and IC today.  - Hemoglobin A1c - Comprehensive metabolic panel  3. Essential hypertension At goal. Medications: Norvasc 5 mg daily, metoprolol 25 mg daily, and chlorthalidone 25 mg daily.   He was diagnosed several years ago.  Occasionally check at home and it runs 110-120s/70s-80s.  Plan: Avoid buying foods that are: processed, frozen, or prepackaged to avoid excess salt. We will watch for signs of hypotension as he continues lifestyle modifications.  Check labs.  At goal today.  Continue medications.  BP Readings from Last 3 Encounters:  09/03/20 114/76  04/01/20 108/82  10/01/19 118/72   Lab Results  Component Value Date   CREATININE 1.18 09/03/2020   - T3 - T4, free - TSH - Hemoglobin A1c - Comprehensive metabolic panel  4. Other hyperlipidemia Course: At goal. Lipid-lowering medications:  fenofibrate 48 mg daily, Crestor 10 mg daily.  Diagnosed 1 year or so ago.  Plan: Dietary changes: Increase soluble fiber, decrease simple carbohydrates, decrease saturated fat. Exercise changes: Moderate to vigorous-intensity aerobic activity 150 minutes per week or as tolerated. We will continue to monitor along with PCP/specialists as it pertains to his weight loss journey.  Check labs today.  Lab Results  Component Value Date   CHOL 141 09/03/2020   HDL 57 09/03/2020   LDLCALC 59 09/03/2020   LDLDIRECT 67.0 02/05/2020   TRIG 144 09/03/2020   CHOLHDL 3 03/25/2020   Lab Results  Component Value Date   ALT 12 09/03/2020   AST 25 09/03/2020   ALKPHOS 63 09/03/2020   BILITOT 1.0 09/03/2020   The 10-year ASCVD risk score Kevin Newman., et al., 2013) is: 1.4%   Values used to calculate the score:     Age: 17 years     Sex: Male     Is Non-Hispanic African American: No      Diabetic: No     Tobacco smoker: No     Systolic Blood Pressure: 562 mmHg     Is BP treated: Yes     HDL Cholesterol: 57 mg/dL     Total Cholesterol: 141 mg/dL  - Lipid Panel With LDL/HDL Ratio - Hemoglobin A1c - Comprehensive metabolic panel  5. Visceral obesity Current visceral fat rating: 23. Visceral fat rating should be < 13. Visceral adipose tissue is a hormonally active component of total body fat. This body composition phenotype is associated with medical disorders such as metabolic syndrome, cardiovascular disease and several malignancies including prostate, breast, and colorectal cancers. Starting goal: Lose 7-10% of starting weight.   6. Obstructive sleep apnea syndrome- no cpap Has CPAP machine at home but has not used it in 8 years or so.    Plan:  I recommend that he follow the recommendations of his PCP regarding treatment options.  Counseling done on the importance of control of OSA to prevent detrimental effects  on multiple aspects of his health and quality of life.  OSA is a cause of systemic hypertension and is associated with an increased incidence of stroke, heart failure, atrial fibrillation, and coronary heart disease. Severe OSA increases all-cause mortality and cardiovascular mortality.   Goal: Treatment of OSA via CPAP compliance and weight loss. Plasma ghrelin levels (appetite or "hunger hormone") are significantly higher in OSA patients than in BMI-matched controls, but decrease to levels similar to those of obese patients without OSA after CPAP treatment.  Weight loss improves OSA by several mechanisms, including reduction in fatty tissue in the throat (i.e. parapharyngeal fat) and the tongue. Loss of abdominal fat increases mediastinal traction on the upper airway making it less likely to collapse during sleep. Studies have also shown that compliance with CPAP treatment improves leptin (hunger inhibitory hormone) imbalance.  7. Depression screening Kevin Newman was  screened for depression as part of his new patient workup today.  PHQ-9 is 8.  Kevin Newman had a positive depression screening. Depression is commonly associated with obesity and often results in emotional eating behaviors. We will monitor this closely and work on CBT to help improve the non-hunger eating patterns. Referral to Psychology may be required if no improvement is seen as he continues in our clinic.  8. At risk for heart disease Due to Kevin Newman current state of health and medical condition(s), he is at a higher risk for heart disease.  This puts the patient at much greater risk to subsequently develop cardiopulmonary conditions that can significantly affect patient's quality of life in a negative manner.    At least 23 minutes were spent on counseling Kevin Newman about these concerns today, and I stressed the importance of reversing risks factors of obesity, especially truncal and visceral fat, hypertension, hyperlipidemia, and pre-diabetes.  The initial goal is to lose at least 5-10% of starting weight to help reduce these risk factors.  Counseling:  Intensive lifestyle modifications were discussed with Kevin Newman as the most appropriate first line of treatment.  he will continue to work on diet, exercise, and weight loss efforts.  We will continue to reassess these conditions on a fairly regular basis in an attempt to decrease the patient's overall morbidity and mortality.  Evidence-based interventions for health behavior change were utilized today including the discussion of self monitoring techniques, problem-solving barriers, and SMART goal setting techniques.  Specifically, regarding patient's less desirable eating habits and patterns, we employed the technique of small changes when Kevin Newman has not been able to fully commit to his prudent nutritional plan.  9. Class 3 severe obesity with serious comorbidity and body mass index (BMI) of 40.0 to 44.9 in adult, unspecified obesity type Kevin Newman)  Kevin Newman is  currently in the action stage of change and his goal is to continue with weight loss efforts. I recommend Kevin Newman begin the structured treatment plan as follows:  He has agreed to the Category 1 Plan.  Exercise goals:  As is.    Behavioral modification strategies: increasing lean protein intake, decreasing simple carbohydrates, and planning for success.  He was informed of the importance of frequent follow-up visits to maximize his success with intensive lifestyle modifications for his multiple health conditions. He was informed we would discuss his lab results at his next visit unless there is a critical issue that needs to be addressed sooner. Kevin Newman agreed to keep his next visit at the agreed upon time to discuss these results.  Objective:   Blood pressure 114/76, pulse 76, temperature 98.1 F (36.7  C), height 5\' 8"  (1.727 m), weight 289 lb (131.1 kg), SpO2 97 %. Body mass index is 43.94 kg/m.  EKG: Normal sinus rhythm, rate 74 bpm.  Indirect Calorimeter completed today shows a VO2 of 183 and a REE of 1276.  His calculated basal metabolic rate is 2620 thus his basal metabolic rate is worse than expected.  General: Cooperative, alert, well developed, in no acute distress. HEENT: Conjunctivae and lids unremarkable. Cardiovascular: Regular rhythm.  Lungs: Normal work of breathing. Neurologic: No focal deficits.   Lab Results  Component Value Date   CREATININE 1.18 09/03/2020   BUN 18 09/03/2020   NA 139 09/03/2020   K 3.8 09/03/2020   CL 97 09/03/2020   CO2 26 09/03/2020   Lab Results  Component Value Date   ALT 12 09/03/2020   AST 25 09/03/2020   ALKPHOS 63 09/03/2020   BILITOT 1.0 09/03/2020   Lab Results  Component Value Date   HGBA1C 5.5 09/03/2020   HGBA1C 5.1 10/04/2017   Lab Results  Component Value Date   INSULIN 23.7 09/03/2020   Lab Results  Component Value Date   TSH 2.320 09/03/2020   Lab Results  Component Value Date   CHOL 141 09/03/2020   HDL 57  09/03/2020   LDLCALC 59 09/03/2020   LDLDIRECT 67.0 02/05/2020   TRIG 144 09/03/2020   CHOLHDL 3 03/25/2020   Lab Results  Component Value Date   VD25OH 81.7 09/03/2020   Lab Results  Component Value Date   WBC 6.8 09/03/2020   HGB 15.4 09/03/2020   HCT 46.0 09/03/2020   MCV 93 09/03/2020   PLT 224 09/03/2020   Attestation Statements:   This is the patient's first visit at [facility]. The patient's NEW PATIENT PACKET was reviewed at length. Included in the packet: current and past health history, medications, allergies, ROS, gynecologic history (women only), surgical history, family history, social history, weight history, weight loss surgery history (for those that have had weight loss surgery), nutritional evaluation, mood and food questionnaire, PHQ9, Epworth questionnaire, sleep habits questionnaire, patient life and health improvement goals questionnaire. These will all be scanned into the patient's chart under media.   During the visit, I independently reviewed the patient's EKG, bioimpedance scale results, and indirect calorimeter results. I used this information to tailor a meal plan for the patient that will help him to lose weight and will improve his obesity-related conditions going forward. I performed a medically necessary appropriate examination and/or evaluation. I discussed the assessment and treatment plan with the patient. The patient was provided an opportunity to ask questions and all were answered. The patient agreed with the plan and demonstrated an understanding of the instructions. Labs were ordered at this visit and will be reviewed at the next visit unless more critical results need to be addressed immediately. Clinical information was updated and documented in the EMR.   I, Water quality scientist, CMA, am acting as Location manager for [employer], DO.  I have reviewed the above documentation for accuracy and completeness, and I agree with the above. Kevin Sneddon, D.O.  The [place] was signed into law in 2016 which includes the topic of electronic health records.  This provides immediate access to information in MyChart.  This includes consultation notes, operative notes, office notes, lab results and pathology reports.  If you have any questions about what you read please let us know at your next visit so we can discuss your  concerns and take corrective action if need be.  We are right here with you.

## 2020-09-17 ENCOUNTER — Other Ambulatory Visit: Payer: Self-pay

## 2020-09-17 ENCOUNTER — Ambulatory Visit (INDEPENDENT_AMBULATORY_CARE_PROVIDER_SITE_OTHER): Payer: BC Managed Care – PPO | Admitting: Family Medicine

## 2020-09-17 ENCOUNTER — Encounter (INDEPENDENT_AMBULATORY_CARE_PROVIDER_SITE_OTHER): Payer: Self-pay | Admitting: Family Medicine

## 2020-09-17 VITALS — BP 120/80 | HR 87 | Temp 98.1°F | Ht 68.0 in | Wt 283.0 lb

## 2020-09-17 DIAGNOSIS — I10 Essential (primary) hypertension: Secondary | ICD-10-CM | POA: Diagnosis not present

## 2020-09-17 DIAGNOSIS — Z6841 Body Mass Index (BMI) 40.0 and over, adult: Secondary | ICD-10-CM

## 2020-09-17 DIAGNOSIS — E7849 Other hyperlipidemia: Secondary | ICD-10-CM

## 2020-09-17 DIAGNOSIS — E559 Vitamin D deficiency, unspecified: Secondary | ICD-10-CM

## 2020-09-17 DIAGNOSIS — E8881 Metabolic syndrome: Secondary | ICD-10-CM | POA: Diagnosis not present

## 2020-09-17 DIAGNOSIS — Z9189 Other specified personal risk factors, not elsewhere classified: Secondary | ICD-10-CM | POA: Diagnosis not present

## 2020-09-17 MED ORDER — VITAMIN D (ERGOCALCIFEROL) 1.25 MG (50000 UNIT) PO CAPS: 50000.0000 [IU] | ORAL_CAPSULE | ORAL | 0 refills | Status: DC

## 2020-09-21 ENCOUNTER — Other Ambulatory Visit: Payer: Self-pay | Admitting: Family Medicine

## 2020-09-25 NOTE — Progress Notes (Signed)
Chief Complaint:   OBESITY Kevin Newman is here to discuss his progress with his obesity treatment plan along with follow-up of his obesity related diagnoses. Kevin Newman is on the Category 1 Plan and states he is following his eating plan approximately 40% of the time. Kevin Newman states he has not been exercising.   Today's visit was #: 2 Starting weight: 289 lbs Starting date: 09/03/2020 Today's weight: 283 lbs Today's date: 09/17/2020 Total lbs lost to date: 6 Total lbs lost since last in-office visit: 6  Interim History: Kevin Newman is here with his wife, who started the program as well. They were traveling to [place] for the weekend and did the best that they could. He denies hunger or cravings. It was tough to get all the foods in at times. Kevin Newman states that he feels full a lot of the time.  Subjective:   1. Vitamin D deficiency He is currently taking OTC vitamin D 10,000 IU qd which is 70,000 IU weekly now.  He denies nausea, vomiting or muscle weakness.  Lab Results  Component Value Date   VD25OH 81.7 09/03/2020   2. Other hyperlipidemia Kevin Newman has hyperlipidemia and is on fenofibrate and Crestor. He has been trying to improve his cholesterol levels with intensive lifestyle modification including a low saturated fat diet, exercise and weight loss. He denies any chest pain, claudication or myalgias.  Lab Results  Component Value Date   ALT 12 09/03/2020   AST 25 09/03/2020   ALKPHOS 63 09/03/2020   BILITOT 1.0 09/03/2020   Lab Results  Component Value Date   CHOL 141 09/03/2020   HDL 57 09/03/2020   LDLCALC 59 09/03/2020   LDLDIRECT 67.0 02/05/2020   TRIG 144 09/03/2020   CHOLHDL 3 03/25/2020   3. Essential hypertension Cardiovascular ROS: Yazid is taking Norvasc, chlorthalidone, and Toprol XL.  BP Readings from Last 3 Encounters:  09/17/20 120/80  09/03/20 114/76  04/01/20 108/82   Lab Results  Component Value Date   CREATININE 1.18 09/03/2020   CREATININE 1.06  02/05/2020   CREATININE 0.99 10/01/2019   4. Insulin resistance Kevin Newman has a diagnosis of insulin resistance based on his elevated fasting insulin level >5. He continues to work on diet and exercise to decrease his risk of diabetes. He is not on medication at this time.  Lab Results  Component Value Date   INSULIN 23.7 09/03/2020   Lab Results  Component Value Date   HGBA1C 5.5 09/03/2020   5. At risk for heart disease Kevin Newman is at a higher than average risk for cardiovascular disease due to hypertension, hyperlipidemia, and obesity.    Assessment/Plan:  No orders of the defined types were placed in this encounter.   Medications Discontinued During This Encounter  Medication Reason   Cholecalciferol (VITAMIN D-3 PO) Error     Meds ordered this encounter  Medications   Vitamin D, Ergocalciferol, (DRISDOL) 1.25 MG (50000 UNIT) CAPS capsule    Sig: Take 1 capsule (50,000 Units total) by mouth every 7 (seven) days.    Dispense:  4 capsule    Refill:  0    Ov for rf, 30 d supply     1. Vitamin D deficiency Trusten agrees to discontinue OTC vitamin D. He agrees to begin to take prescription Vitamin D '@50'$ ,000 IU every week and will follow-up for routine testing of Vitamin D, at least 2-3 times per year to avoid over-replacement. Low Vitamin D level contributes to fatigue and are associated with obesity, breast,  and colon cancer. Education and diagnosis counseling was done today.  - Vitamin D, Ergocalciferol, (DRISDOL) 1.25 MG (50000 UNIT) CAPS capsule; Take 1 capsule (50,000 Units total) by mouth every 7 (seven) days.  Dispense: 4 capsule; Refill: 0  2. Other hyperlipidemia Cardiovascular risk and specific lipid/LDL goals reviewed.  We discussed several lifestyle modifications today and Daqwan will continue to work on diet, exercise and weight loss efforts. Orders and follow up as documented in patient record.   Counseling Intensive lifestyle modifications are the first line  treatment for this issue. Dietary changes: Increase soluble fiber. Decrease simple carbohydrates. Exercise changes: Moderate to vigorous-intensity aerobic activity 150 minutes per week if tolerated. Lipid-lowering medications: see documented in medical record.  3. Essential hypertension Kevin Newman is at goal and is working on healthy weight loss and exercise to improve blood pressure control. We will watch for signs of hypotension as he continues his lifestyle modifications.  4. Insulin resistance Kevin Newman will continue to work on weight loss, exercise, and decreasing simple carbohydrates to help decrease the risk of diabetes. Kevin Newman agreed to follow-up with [place] as directed to closely monitor his progress.  5. At risk for heart disease Kevin Newman was given approximately 23 minutes of coronary artery disease prevention counseling today. He is 49 y.o. male and has risk factors for heart disease including hyperlipidemia, hypertension, and obesity. We discussed intensive lifestyle modifications today with an emphasis on specific weight loss instructions and strategies.   Repetitive spaced learning was employed today to elicit superior memory formation and behavioral change.   6. Obesity with current BMI of 43.1 Kevin Newman is currently in the action stage of change. As such, his goal is to continue with weight loss efforts. He has agreed to the Category 1 Plan with breakfast options.   Exercise goals:  As is.  Behavioral modification strategies: increasing lean protein intake, decreasing simple carbohydrates, meal planning and cooking strategies, keeping healthy foods in the home, and planning for success.  Kevin Newman has agreed to follow-up with our clinic in 3 weeks. He was informed of the importance of frequent follow-up visits to maximize his success with intensive lifestyle modifications for his multiple health conditions.   Objective:   Blood pressure 120/80, pulse 87, temperature 98.1 F (36.7 C), height  '5\' 8"'$  (1.727 m), weight 283 lb (128.4 kg), SpO2 96 %. Body mass index is 43.03 kg/m.  General: Cooperative, alert, well developed, in no acute distress. HEENT: Conjunctivae and lids unremarkable. Cardiovascular: Regular rhythm.  Lungs: Normal work of breathing. Neurologic: No focal deficits.   Lab Results  Component Value Date   CREATININE 1.18 09/03/2020   BUN 18 09/03/2020   NA 139 09/03/2020   K 3.8 09/03/2020   CL 97 09/03/2020   CO2 26 09/03/2020   Lab Results  Component Value Date   ALT 12 09/03/2020   AST 25 09/03/2020   ALKPHOS 63 09/03/2020   BILITOT 1.0 09/03/2020   Lab Results  Component Value Date   HGBA1C 5.5 09/03/2020   HGBA1C 5.1 10/04/2017   Lab Results  Component Value Date   INSULIN 23.7 09/03/2020   Lab Results  Component Value Date   TSH 2.320 09/03/2020   Lab Results  Component Value Date   CHOL 141 09/03/2020   HDL 57 09/03/2020   LDLCALC 59 09/03/2020   LDLDIRECT 67.0 02/05/2020   TRIG 144 09/03/2020   CHOLHDL 3 03/25/2020   Lab Results  Component Value Date   VD25OH 81.7 09/03/2020   Lab Results  Component Value Date   WBC 6.8 09/03/2020   HGB 15.4 09/03/2020   HCT 46.0 09/03/2020   MCV 93 09/03/2020   PLT 224 09/03/2020   No results found for: IRON, TIBC, FERRITIN  Attestation Statements:   Reviewed by clinician on day of visit: allergies, medications, problem list, medical history, surgical history, family history, social history, and previous encounter notes.  IMarcille Blanco, CMA, am acting as transcriptionist for [employer], DO   I have reviewed the above documentation for accuracy and completeness, and I agree with the above. Marjory Sneddon, D.O.  The [place] was signed into law in 2016 which includes the topic of electronic health records.  This provides immediate access to information in MyChart.  This includes consultation notes, operative notes, office notes, lab results and pathology  reports.  If you have any questions about what you read please let us know at your next visit so we can discuss your concerns and take corrective action if need be.  We are right here with you.

## 2020-10-02 ENCOUNTER — Encounter: Payer: BC Managed Care – PPO | Admitting: Family Medicine

## 2020-10-05 ENCOUNTER — Encounter (INDEPENDENT_AMBULATORY_CARE_PROVIDER_SITE_OTHER): Payer: Self-pay | Admitting: Family Medicine

## 2020-10-05 ENCOUNTER — Other Ambulatory Visit: Payer: Self-pay

## 2020-10-05 ENCOUNTER — Ambulatory Visit (INDEPENDENT_AMBULATORY_CARE_PROVIDER_SITE_OTHER): Payer: BC Managed Care – PPO | Admitting: Family Medicine

## 2020-10-05 VITALS — BP 119/87 | HR 71 | Temp 98.0°F | Ht 68.0 in | Wt 278.0 lb

## 2020-10-05 DIAGNOSIS — Z6841 Body Mass Index (BMI) 40.0 and over, adult: Secondary | ICD-10-CM

## 2020-10-05 DIAGNOSIS — E559 Vitamin D deficiency, unspecified: Secondary | ICD-10-CM | POA: Diagnosis not present

## 2020-10-05 DIAGNOSIS — I10 Essential (primary) hypertension: Secondary | ICD-10-CM | POA: Diagnosis not present

## 2020-10-06 NOTE — Progress Notes (Signed)
Chief Complaint:   OBESITY Laurie is here to discuss his progress with his obesity treatment plan along with follow-up of his obesity related diagnoses. Abhijit is on the Category 1 Plan with breakfast options and states he is following his eating plan approximately 50% of the time. Leiby states he is doing 0 minutes 0 times per week.  Today's visit was #: 3 Starting weight: 289 lbs Starting date: 09/03/2020 Today's weight: 278 lbs Today's date: 10/05/2020 Total lbs lost to date: 11 Total lbs lost since last in-office visit: 5  Interim History: [place] came back from vacation and he tried to stay on the plan as much as possible. He is still struggling with getting all of his protein in and water as well.  Subjective:   1. Essential hypertension Winston is taking Toprol XL, Norvasc, and chlorthalidone. His blood pressure is at goal. Cardiovascular ROS: no chest pain or dyspnea on exertion.  2. Vitamin D deficiency Vane is currently taking prescription vitamin D 50,000 IU each week. He denies nausea, vomiting or muscle weakness.  Assessment/Plan:    1. Essential hypertension Chief will continue working on healthy weight loss and exercise to improve blood pressure control. We will watch for signs of hypotension as he continues his lifestyle modifications.  2. Vitamin D deficiency Low Vitamin D level contributes to fatigue and are associated with obesity, breast, and colon cancer. Bow will continue prescription Vitamin D 50,000 IU every week and denies a need for a refill. He will follow-up for routine testing of Vitamin D, at least 2-3 times per year to avoid over-replacement.  3. Obesity with current BMI of 62.5 Askia is currently in the action stage of change. As such, his goal is to continue with weight loss efforts. He has agreed to the Category 1 Plan with breakfast options.   Exercise goals: As is.  Behavioral modification strategies: increasing lean protein intake,  decreasing simple carbohydrates, and meal planning and cooking strategies.  Dshaun has agreed to follow-up with our clinic in 3 weeks. He was informed of the importance of frequent follow-up visits to maximize his success with intensive lifestyle modifications for his multiple health conditions.   Objective:   Blood pressure 119/87, pulse 71, temperature 98 F (36.7 C), height '5\' 8"'$  (1.727 m), weight 278 lb (126.1 kg), SpO2 96 %. Body mass index is 42.27 kg/m.  General: Cooperative, alert, well developed, in no acute distress. HEENT: Conjunctivae and lids unremarkable. Cardiovascular: Regular rhythm.  Lungs: Normal work of breathing. Neurologic: No focal deficits.   Lab Results  Component Value Date   CREATININE 1.18 09/03/2020   BUN 18 09/03/2020   NA 139 09/03/2020   K 3.8 09/03/2020   CL 97 09/03/2020   CO2 26 09/03/2020   Lab Results  Component Value Date   ALT 12 09/03/2020   AST 25 09/03/2020   ALKPHOS 63 09/03/2020   BILITOT 1.0 09/03/2020   Lab Results  Component Value Date   HGBA1C 5.5 09/03/2020   HGBA1C 5.1 10/04/2017   Lab Results  Component Value Date   INSULIN 23.7 09/03/2020   Lab Results  Component Value Date   TSH 2.320 09/03/2020   Lab Results  Component Value Date   CHOL 141 09/03/2020   HDL 57 09/03/2020   LDLCALC 59 09/03/2020   LDLDIRECT 67.0 02/05/2020   TRIG 144 09/03/2020   CHOLHDL 3 03/25/2020   Lab Results  Component Value Date   VD25OH 81.7 09/03/2020   Lab  Results  Component Value Date   WBC 6.8 09/03/2020   HGB 15.4 09/03/2020   HCT 46.0 09/03/2020   MCV 93 09/03/2020   PLT 224 09/03/2020   No results found for: IRON, TIBC, FERRITIN  Attestation Statements:   Reviewed by clinician on day of visit: allergies, medications, problem list, medical history, surgical history, family history, social history, and previous encounter notes.  Time spent on visit including pre-visit chart review and post-visit care and charting  was 30 minutes.    Wilhemena Durie, am acting as transcriptionist for [employer], DO.  I have reviewed the above documentation for accuracy and completeness, and I agree with the above. Marjory Sneddon, D.O.  [place] was signed into law in 2016 which includes the topic of electronic health records.  This provides immediate access to information in MyChart.  This includes consultation notes, operative notes, office notes, lab results and pathology reports.  If you have any questions about what you read please let us know at your next visit so we can discuss your concerns and take corrective action if need be.  We are right here with you.

## 2020-10-08 DIAGNOSIS — F4321 Adjustment disorder with depressed mood: Secondary | ICD-10-CM | POA: Diagnosis not present

## 2020-10-14 ENCOUNTER — Other Ambulatory Visit (INDEPENDENT_AMBULATORY_CARE_PROVIDER_SITE_OTHER): Payer: Self-pay | Admitting: Family Medicine

## 2020-10-14 DIAGNOSIS — E559 Vitamin D deficiency, unspecified: Secondary | ICD-10-CM

## 2020-10-16 ENCOUNTER — Encounter: Payer: Self-pay | Admitting: Family Medicine

## 2020-10-16 ENCOUNTER — Other Ambulatory Visit: Payer: Self-pay

## 2020-10-16 ENCOUNTER — Ambulatory Visit (INDEPENDENT_AMBULATORY_CARE_PROVIDER_SITE_OTHER): Payer: BC Managed Care – PPO | Admitting: Family Medicine

## 2020-10-16 VITALS — BP 120/78 | HR 73 | Temp 97.9°F | Ht 68.0 in | Wt 280.1 lb

## 2020-10-16 DIAGNOSIS — Z0000 Encounter for general adult medical examination without abnormal findings: Secondary | ICD-10-CM

## 2020-10-16 DIAGNOSIS — J302 Other seasonal allergic rhinitis: Secondary | ICD-10-CM

## 2020-10-16 DIAGNOSIS — Z125 Encounter for screening for malignant neoplasm of prostate: Secondary | ICD-10-CM | POA: Diagnosis not present

## 2020-10-16 LAB — COMPREHENSIVE METABOLIC PANEL
ALT: 10 U/L (ref 0–53)
AST: 22 U/L (ref 0–37)
Albumin: 4.5 g/dL (ref 3.5–5.2)
Alkaline Phosphatase: 52 U/L (ref 39–117)
BUN: 28 mg/dL — ABNORMAL HIGH (ref 6–23)
CO2: 29 mEq/L (ref 19–32)
Calcium: 9.8 mg/dL (ref 8.4–10.5)
Chloride: 99 mEq/L (ref 96–112)
Creatinine, Ser: 1.16 mg/dL (ref 0.40–1.50)
GFR: 74.59 mL/min (ref >60.00)
Glucose, Bld: 84 mg/dL (ref 70–99)
Potassium: 3.8 mEq/L (ref 3.5–5.1)
Sodium: 139 mEq/L (ref 135–145)
Total Bilirubin: 1.5 mg/dL — ABNORMAL HIGH (ref 0.2–1.2)
Total Protein: 6.8 g/dL (ref 6.0–8.3)

## 2020-10-16 LAB — LIPID PANEL
Cholesterol: 104 mg/dL (ref 0–200)
HDL: 50.9 mg/dL (ref >39.00)
LDL Cholesterol: 37 mg/dL (ref 0–99)
NonHDL: 52.67
Total CHOL/HDL Ratio: 2
Triglycerides: 80 mg/dL (ref 0.0–149.0)
VLDL: 16 mg/dL (ref 0.0–40.0)

## 2020-10-16 LAB — CBC
HCT: 45.7 % (ref 39.0–52.0)
Hemoglobin: 15.4 g/dL (ref 13.0–17.0)
MCHC: 33.6 g/dL (ref 30.0–36.0)
MCV: 90.5 fl (ref 78.0–100.0)
Platelets: 229 10*3/uL (ref 150.0–400.0)
RBC: 5.05 Mil/uL (ref 4.22–5.81)
RDW: 13.4 % (ref 11.5–15.5)
WBC: 5.7 10*3/uL (ref 4.0–10.5)

## 2020-10-16 LAB — PSA: PSA: 0.69 ng/mL (ref 0.10–4.00)

## 2020-10-16 NOTE — Patient Instructions (Signed)
Give us 2-3 business days to get the results of your labs back.   Keep the diet clean and stay active.  I recommend getting the flu shot in mid October. This suggestion would change if the CDC comes out with a different recommendation.   Let us know if you need anything. 

## 2020-10-16 NOTE — Progress Notes (Signed)
Chief Complaint  Patient presents with   Annual Exam    Well Male Kevin Newman is here for a complete physical.   His last physical was >1 year ago.  Current diet: in general, a "healthy" diet.   Current exercise: some walking, swimming Weight trend: intentionally losing Fatigue out of ordinary? No. Seat belt? Yes.    Health maintenance Tetanus- Yes HIV- Yes Hep C- Yes  Past Medical History:  Diagnosis Date   Anxiety    Back pain    Constipation    Diverticulosis    Essential hypertension 04/22/2016   High cholesterol    Hypertension    Hypogonadism male 08/18/2016   Internal hemorrhoids    Sleep apnea    boderline, have CPAP   Swelling      Past Surgical History:  Procedure Laterality Date   NO PAST SURGERIES      Medications  Current Outpatient Medications on File Prior to Visit  Medication Sig Dispense Refill   amLODipine (NORVASC) 5 MG tablet Take 1 tablet (5 mg total) by mouth daily. 90 tablet 3   chlorthalidone (HYGROTON) 25 MG tablet Take 1 tablet (25 mg total) by mouth daily. 90 tablet 1   fenofibrate (TRICOR) 48 MG tablet Take 1 tablet (48 mg total) by mouth daily. 90 tablet 2   loratadine (CLARITIN) 10 MG tablet      Magnesium 400 MG TABS Take 400 mg by mouth 2 (two) times daily.     metoprolol succinate (TOPROL-XL) 25 MG 24 hr tablet TAKE 1 TABLET(25 MG) BY MOUTH DAILY 90 tablet 3   Multiple Vitamins-Minerals (MULTIVITAMIN ADULT PO) Take by mouth.     OVER THE COUNTER MEDICATION BIOTIC DETOXIFICATION SUPPORT.  Take 1 tablet daily     potassium chloride (KLOR-CON) 10 MEQ tablet TAKE 2 TABLETS(20 MEQ) BY MOUTH DAILY 180 tablet 1   rosuvastatin (CRESTOR) 10 MG tablet TAKE 1 TABLET(10 MG) BY MOUTH DAILY 90 tablet 1   Vitamin D, Ergocalciferol, (DRISDOL) 1.25 MG (50000 UNIT) CAPS capsule Take 1 capsule (50,000 Units total) by mouth every 7 (seven) days. 4 capsule 0   vitamin E 1000 UNIT capsule Take 1,000 Units by mouth daily.     Allergies Allergies   Allergen Reactions   Amoxil [Amoxicillin]    Sulfa Antibiotics     Family History Family History  Problem Relation Age of Onset   Hypertension Mother    Thyroid disease Mother    Gallbladder disease Mother    Gallbladder disease Father    Heart Problems Father    Heart disease Maternal Grandmother 40   Heart attack Maternal Grandfather    Stroke Maternal Grandfather    Breast cancer Paternal Grandmother    Prostate cancer Paternal Grandfather    Cancer Maternal Uncle        type unknown    Review of Systems: Constitutional: no fevers or chills Eye:  no recent significant change in vision Ear/Nose/Mouth/Throat:  Ears:  no hearing loss Nose/Mouth/Throat:  no complaints of nasal congestion, no sore throat Cardiovascular:  no chest pain Respiratory:  no shortness of breath Gastrointestinal:  no abdominal pain, no change in bowel habits GU:  Male: negative for dysuria, frequency, and incontinence Musculoskeletal/Extremities:  no pain of the joints Integumentary (Skin/Breast):  no abnormal skin lesions reported Neurologic:  no headaches Endocrine: No unexpected weight changes Hematologic/Lymphatic:  no night sweats  Exam BP 120/78   Pulse 73   Temp 97.9 F (36.6 C) (Oral)   Ht  $'5\' 8"'i$  (1.727 m)   Wt 280 lb 2 oz (127.1 kg)   SpO2 97%   BMI 42.59 kg/m  General:  well developed, well nourished, in no apparent distress Skin:  no significant moles, warts, or growths Head:  no masses, lesions, or tenderness Eyes:  pupils equal and round, sclera anicteric without injection Ears:  canals without lesions, TMs shiny without retraction, no obvious effusion, no erythema Nose:  nares patent, septum midline, mucosa normal Throat/Pharynx:  lips and gingiva without lesion; tongue and uvula midline; non-inflamed pharynx; no exudates or postnasal drainage Neck: neck supple without adenopathy, thyromegaly, or masses Lungs:  clear to auscultation, breath sounds equal bilaterally, no  respiratory distress Cardio:  regular rate and rhythm, no bruits, no LE edema Abdomen:  abdomen soft, nontender; bowel sounds normal; no masses or organomegaly Rectal: Deferred Musculoskeletal:  symmetrical muscle groups noted without atrophy or deformity Extremities:  no clubbing, cyanosis, or edema, no deformities, no skin discoloration Neuro:  gait normal; deep tendon reflexes normal and symmetric Psych: well oriented with normal range of affect and appropriate judgment/insight  Assessment and Plan  Well adult exam - Plan: CBC, Comprehensive metabolic panel, Lipid panel  Screening for prostate cancer - Plan: PSA  Seasonal allergies - Plan: Ambulatory referral to Allergy   Well 50 y.o. male. Counseled on diet and exercise. Counseled on risks and benefits of prostate cancer screening with PSA. The patient agrees to undergo screening.  Flu shot recd for Oct.  Covid vaccine rec'd. Pre-contemplative. Encouraged to reach out if he has questions.  Requesting referral to allergy team, will place today. Other orders as above. Follow up in 6 mo pending the above workup. The patient voiced understanding and agreement to the plan.  [place], DO 10/16/20 10:09 AM

## 2020-10-20 ENCOUNTER — Other Ambulatory Visit (INDEPENDENT_AMBULATORY_CARE_PROVIDER_SITE_OTHER): Payer: Self-pay | Admitting: Family Medicine

## 2020-10-20 DIAGNOSIS — E559 Vitamin D deficiency, unspecified: Secondary | ICD-10-CM

## 2020-10-21 ENCOUNTER — Encounter (INDEPENDENT_AMBULATORY_CARE_PROVIDER_SITE_OTHER): Payer: Self-pay

## 2020-10-27 ENCOUNTER — Ambulatory Visit (INDEPENDENT_AMBULATORY_CARE_PROVIDER_SITE_OTHER): Payer: BC Managed Care – PPO | Admitting: Family Medicine

## 2020-10-27 ENCOUNTER — Encounter (INDEPENDENT_AMBULATORY_CARE_PROVIDER_SITE_OTHER): Payer: Self-pay | Admitting: Family Medicine

## 2020-10-27 ENCOUNTER — Other Ambulatory Visit: Payer: Self-pay

## 2020-10-27 VITALS — BP 108/71 | HR 67 | Temp 97.6°F | Ht 68.0 in | Wt 273.0 lb

## 2020-10-27 DIAGNOSIS — E559 Vitamin D deficiency, unspecified: Secondary | ICD-10-CM

## 2020-10-27 DIAGNOSIS — Z6841 Body Mass Index (BMI) 40.0 and over, adult: Secondary | ICD-10-CM | POA: Diagnosis not present

## 2020-10-27 DIAGNOSIS — Z9189 Other specified personal risk factors, not elsewhere classified: Secondary | ICD-10-CM

## 2020-10-27 MED ORDER — VITAMIN D (ERGOCALCIFEROL) 1.25 MG (50000 UNIT) PO CAPS: 50000.0000 [IU] | ORAL_CAPSULE | ORAL | 0 refills | Status: DC

## 2020-10-28 NOTE — Progress Notes (Signed)
Chief Complaint:   OBESITY Kevin Newman is here to discuss his progress with his obesity treatment plan along with follow-up of his obesity related diagnoses. Kevin Newman is on the Category 1 Plan and states he is following his eating plan approximately 70% of the time. Kevin Newman states he is not currently exercising.  Today's visit was #: 4 Starting weight: 289 lbs Starting date: 09/03/2020 Today's weight: 273 lbs Today's date: 10/27/2020 Total lbs lost to date: 16 Total lbs lost since last in-office visit: 5  Interim History: Kevin Newman is more focused on proteins. He reports some cravings at night but is using positive strategies to manage it.  Assessment/Plan:  No orders of the defined types were placed in this encounter.   Medications Discontinued During This Encounter  Medication Reason   Vitamin D, Ergocalciferol, (DRISDOL) 1.25 MG (50000 UNIT) CAPS capsule Reorder     Meds ordered this encounter  Medications   Vitamin D, Ergocalciferol, (DRISDOL) 1.25 MG (50000 UNIT) CAPS capsule    Sig: Take 1 capsule (50,000 Units total) by mouth every 7 (seven) days.    Dispense:  4 capsule    Refill:  0    Ov for rf, 30 d supply     1. Vitamin D deficiency At goal.   Plan: Continue to take prescription Vitamin D '@50'$ ,000 IU every week as prescribed.  Follow-up for routine testing of Vitamin D, at least 2-3 times per year to avoid over-replacement.  Lab Results  Component Value Date   VD25OH 81.7 09/03/2020   Refill- Vitamin D, Ergocalciferol, (DRISDOL) 1.25 MG (50000 UNIT) CAPS capsule; Take 1 capsule (50,000 Units total) by mouth every 7 (seven) days.  Dispense: 4 capsule; Refill: 0  2. At risk for dehydration Kevin Newman is at higher than average risk of dehydration due to poor water intake (only 10-20 oz per day).  Kevin Newman was given more than 10 minutes of proper hydration counseling today.  We discussed the signs and symptoms of dehydration, some of which may include muscle cramping,  constipation or even orthostatic symptoms.  Counseling on the prevention of dehydration was also provided today.  Kevin Newman is at risk for dehydration due to weight loss, lifestyle and behavorial habits and possibly due to taking certain medication(s).  He was encouraged to adequately hydrate and monitor fluid status to avoid dehydration as well as weight loss plateaus.  Unless pre-existing renal or cardiopulmonary conditions exist, in which patient was told to limit their fluid intake, I recommended roughly one half of their weight in pounds to be the approximate ounces of non-caloric, non-caffeinated beverages they should drink per day; including more if they are engaging in exercise.  3. Obesity with current BMI of 41.5  Kevin Newman is currently in the action stage of change. As such, his goal is to continue with weight loss efforts. He has agreed to the Category 1 Plan.   Pt will get up to 30 oz of water per day. Weigh proteins and measure vegetables.  Exercise goals:  As is  Behavioral modification strategies: increasing lean protein intake, increasing water intake, and avoiding temptations.  Kevin Newman has agreed to follow-up with our clinic in 3 weeks. He was informed of the importance of frequent follow-up visits to maximize his success with intensive lifestyle modifications for his multiple health conditions.   Objective:   Blood pressure 108/71, pulse 67, temperature 97.6 F (36.4 C), height '5\' 8"'$  (1.727 m), weight 273 lb (123.8 kg), SpO2 96 %. Body mass index is 41.51  kg/m.  General: Cooperative, alert, well developed, in no acute distress. HEENT: Conjunctivae and lids unremarkable. Cardiovascular: Regular rhythm.  Lungs: Normal work of breathing. Neurologic: No focal deficits.   Lab Results  Component Value Date   CREATININE 1.16 10/16/2020   BUN 28 (H) 10/16/2020   NA 139 10/16/2020   K 3.8 10/16/2020   CL 99 10/16/2020   CO2 29 10/16/2020   Lab Results  Component Value Date    ALT 10 10/16/2020   AST 22 10/16/2020   ALKPHOS 52 10/16/2020   BILITOT 1.5 (H) 10/16/2020   Lab Results  Component Value Date   HGBA1C 5.5 09/03/2020   HGBA1C 5.1 10/04/2017   Lab Results  Component Value Date   INSULIN 23.7 09/03/2020   Lab Results  Component Value Date   TSH 2.320 09/03/2020   Lab Results  Component Value Date   CHOL 104 10/16/2020   HDL 50.90 10/16/2020   LDLCALC 37 10/16/2020   LDLDIRECT 67.0 02/05/2020   TRIG 80.0 10/16/2020   CHOLHDL 2 10/16/2020   Lab Results  Component Value Date   VD25OH 81.7 09/03/2020   Lab Results  Component Value Date   WBC 5.7 10/16/2020   HGB 15.4 10/16/2020   HCT 45.7 10/16/2020   MCV 90.5 10/16/2020   PLT 229.0 10/16/2020    Attestation Statements:   Reviewed by clinician on day of visit: allergies, medications, problem list, medical history, surgical history, family history, social history, and previous encounter notes.  Coral Ceo, CMA, am acting as transcriptionist for [employer], DO.  I have reviewed the above documentation for accuracy and completeness, and I agree with the above. Marjory Sneddon, D.O.  The [place] was signed into law in 2016 which includes the topic of electronic health records.  This provides immediate access to information in MyChart.  This includes consultation notes, operative notes, office notes, lab results and pathology reports.  If you have any questions about what you read please let us know at your next visit so we can discuss your concerns and take corrective action if need be.  We are right here with you.

## 2020-11-10 DIAGNOSIS — F4321 Adjustment disorder with depressed mood: Secondary | ICD-10-CM | POA: Diagnosis not present

## 2020-11-19 ENCOUNTER — Ambulatory Visit: Payer: BC Managed Care – PPO | Admitting: Internal Medicine

## 2020-11-24 ENCOUNTER — Ambulatory Visit (INDEPENDENT_AMBULATORY_CARE_PROVIDER_SITE_OTHER): Payer: BC Managed Care – PPO | Admitting: Family Medicine

## 2020-11-26 DIAGNOSIS — F4321 Adjustment disorder with depressed mood: Secondary | ICD-10-CM | POA: Diagnosis not present

## 2020-12-01 ENCOUNTER — Ambulatory Visit (INDEPENDENT_AMBULATORY_CARE_PROVIDER_SITE_OTHER): Payer: BC Managed Care – PPO | Admitting: Family Medicine

## 2020-12-20 ENCOUNTER — Other Ambulatory Visit: Payer: Self-pay | Admitting: Family Medicine

## 2020-12-20 DIAGNOSIS — E782 Mixed hyperlipidemia: Secondary | ICD-10-CM

## 2020-12-20 DIAGNOSIS — I10 Essential (primary) hypertension: Secondary | ICD-10-CM

## 2020-12-21 ENCOUNTER — Ambulatory Visit (INDEPENDENT_AMBULATORY_CARE_PROVIDER_SITE_OTHER): Payer: BC Managed Care – PPO | Admitting: Family Medicine

## 2020-12-21 MED ORDER — FENOFIBRATE 48 MG PO TABS: 48.0000 mg | ORAL_TABLET | Freq: Every day | ORAL | 2 refills | Status: DC

## 2020-12-24 DIAGNOSIS — F4321 Adjustment disorder with depressed mood: Secondary | ICD-10-CM | POA: Diagnosis not present

## 2021-01-01 ENCOUNTER — Encounter: Payer: Self-pay | Admitting: Internal Medicine

## 2021-01-01 ENCOUNTER — Ambulatory Visit: Payer: BC Managed Care – PPO | Admitting: Internal Medicine

## 2021-01-01 ENCOUNTER — Other Ambulatory Visit: Payer: Self-pay

## 2021-01-01 VITALS — BP 110/72 | HR 82 | Temp 98.0°F | Resp 20 | Ht 68.0 in | Wt 276.8 lb

## 2021-01-01 DIAGNOSIS — E739 Lactose intolerance, unspecified: Secondary | ICD-10-CM | POA: Diagnosis not present

## 2021-01-01 DIAGNOSIS — J3089 Other allergic rhinitis: Secondary | ICD-10-CM | POA: Diagnosis not present

## 2021-01-01 DIAGNOSIS — Z0182 Encounter for allergy testing: Secondary | ICD-10-CM | POA: Diagnosis not present

## 2021-01-01 DIAGNOSIS — K9049 Malabsorption due to intolerance, not elsewhere classified: Secondary | ICD-10-CM

## 2021-01-01 MED ORDER — AZELASTINE HCL 0.1 % NA SOLN: 1.0000 | Freq: Two times a day (BID) | NASAL | 5 refills | Status: AC | PRN

## 2021-01-01 NOTE — Patient Instructions (Addendum)
Chronic rhinitis -perennial allergic: - allergy testing today was positive to mold mix 2 and 4 (indoor molds) and cockroach - allergen avoidance as below - consider allergy shots as long term control of your symptoms by teaching your immune system to be more tolerant of your allergy triggers - 1. Start Nasal Steroid Spray: Options include Flonase (fluticasone), Nasocort (triamcinolone), Nasonex (mometasome) 1- 2 sprays in each nostril daily (can buy over-the-counter if not covered by insurance)  Best results if used daily. - 2. If not controlled, Start Astelin (Azelastine) 1-2 sprays in each nostril twice a day as needed.  You may use this as needed for nasal congestion/itchy ears/itchy nose if desired - 3. If not controlled, Continue over the counter antihistamine daily or daily as needed.   -Your options include Zyrtec (Cetirizine) 10mg , Claritin (Loratadine) 10mg , Allegra (Fexofenadine) 180mg , or Xyzal (Levocetirinze) 5mg   Lactose Intolerance- - this is not an allergy but rather a problem with breaking down dairy (due to an enzyme deficiency) which can lead to bloating, gas, diarrhea, nausea and vomiting - choose lactose free dairy or take lactaid prior to eating dairy products that contain lactose  Food Intolerance Plan - start an elimination diet by identifying any particular food or food group that may worsen your symptoms, eliminate these foods, and slowly try reintroducing after at least a 2-4 week period, if symptoms return, this is a food you should avoid - the symptoms you are having are not consistent with a life-threatening food allergy, and there is no reliable allergy testing at this time that can identify food intolerances - food allergy testing today was negative to the top 8 most common allergens as well as barley and rye - consider GI and nutrition referral for help with underlying symptoms and diet   Follow-up in 3 months.   It was a pleasure meeting you in clinic today! If  you receive a survey about today's experience, please consider giving [place] feedback on how we're doing!  Sigurd Sos, MD [facility]   ------------------------------------------------------------- Control of Mold Allergen   Mold and fungi can grow on a variety of surfaces provided certain temperature and moisture conditions exist.  Outdoor molds grow on plants, decaying vegetation and soil.  The major outdoor mold, Alternaria and Cladosporium, are found in very high numbers during hot and dry conditions.  Generally, a late Summer - Fall peak is seen for common outdoor fungal spores.  Rain will temporarily lower outdoor mold spore count, but counts rise rapidly when the rainy period ends.  The most important indoor molds are Aspergillus and Penicillium.  Dark, humid and poorly ventilated basements are ideal sites for mold growth.  The next most common sites of mold growth are the bathroom and the kitchen.   Indoor (Perennial) Mold Control   Positive indoor molds via skin testing: Aspergillus, Penicillium, Fusarium, Aureobasidium (Pullulara), and Rhizopus  Maintain humidity below 50%. Clean washable surfaces with 5% bleach solution. Remove sources e.g. contaminated carpets.   Control of Cockroach Allergen  Cockroach allergen has been identified as an important cause of acute attacks of asthma, especially in urban settings.  There are fifty-five species of cockroach that exist in the [place], however only three, the [place], [place] species produce allergen that can affect patients with Asthma.  Allergens can be obtained from fecal particles, egg casings and secretions from cockroaches.    Remove food sources. Reduce access to water. Seal access and entry points. Spray runways with  0.5-1% Diazinon or Chlorpyrifos Blow boric acid power under stoves and refrigerator. Place bait stations (hydramethylnon) at feeding sites.

## 2021-01-01 NOTE — Progress Notes (Signed)
NEW PATIENT Date of Service/Encounter:  01/01/21 Referring provider: [name]* Primary care provider: [name], DO  Subjective:  Kevin Newman is a 50 y.o. male with a PMHx of hypertension, hypothyroidism, hypogonadism, polycythemia, obstructive sleep apnea presenting today for evaluation of chronic rhinitis History obtained from: chart review and patient.   Chronic rhinitis: Sneezing, seasonal allergies.  Takes Claritin or Xyzal every day.  If he doesn't take this, he feels he has continuous sneezing.  Year round.  Never been allergy tested.  Has thought he might be lactose intolerant and has issues if he eats a lot of dairy. Lactose free dairy he is able to tolerate. Stevia causes him to have bouts of diarrhea every couple of days.  So he avoids.  He uses monk fruit now.   Additionally in general just has abdominal bloating and GI issues which he has seen GI for in the past.  He questions food sensitivities.     Other allergy screening: Asthma: no Food allergy: no Medication allergy: no Urticaria: no Eczema:no-does see Dermatology for seborrheic dermatitis   Past Medical History: Past Medical History:  Diagnosis Date   Anxiety    Back pain    Constipation    Diverticulosis    Essential hypertension 04/22/2016   High cholesterol    Hypertension    Hypogonadism male 08/18/2016   Internal hemorrhoids    Sleep apnea    boderline, have CPAP   Swelling    Medication List:  Current Outpatient Medications  Medication Sig Dispense Refill   amLODipine (NORVASC) 5 MG tablet Take 1 tablet (5 mg total) by mouth daily. 90 tablet 3   azelastine (ASTELIN) 0.1 % nasal spray Place 1-2 sprays into both nostrils 2 (two) times daily as needed. 30 mL 5   chlorthalidone (HYGROTON) 25 MG tablet TAKE 1 TABLET(25 MG) BY MOUTH DAILY 90 tablet 1   fenofibrate (TRICOR) 48 MG tablet Take 1 tablet (48 mg total) by mouth daily. 90 tablet 2   loratadine (CLARITIN)  10 MG tablet      Magnesium 400 MG TABS Take 400 mg by mouth 2 (two) times daily.     metoprolol succinate (TOPROL-XL) 25 MG 24 hr tablet TAKE 1 TABLET(25 MG) BY MOUTH DAILY 90 tablet 3   Multiple Vitamins-Minerals (MULTIVITAMIN ADULT PO) Take by mouth.     OVER THE COUNTER MEDICATION BIOTIC DETOXIFICATION SUPPORT.  Take 1 tablet daily     potassium chloride (KLOR-CON) 10 MEQ tablet TAKE 2 TABLETS(20 MEQ) BY MOUTH DAILY 180 tablet 1   rosuvastatin (CRESTOR) 10 MG tablet TAKE 1 TABLET(10 MG) BY MOUTH DAILY 90 tablet 1   Vitamin D, Ergocalciferol, (DRISDOL) 1.25 MG (50000 UNIT) CAPS capsule Take 1 capsule (50,000 Units total) by mouth every 7 (seven) days. 4 capsule 0   vitamin E 1000 UNIT capsule Take 1,000 Units by mouth daily.     No current facility-administered medications for this visit.   Known Allergies:  Allergies  Allergen Reactions   Amoxil [Amoxicillin]    Sulfa Antibiotics    Past Surgical History: Past Surgical History:  Procedure Laterality Date   NO PAST SURGERIES     Family History: Family History  Problem Relation Age of Onset   Hypertension Mother    Thyroid disease Mother    Gallbladder disease Mother    Gallbladder disease Father    Heart Problems Father    Heart disease Maternal Grandmother 80   Heart attack Maternal Grandfather  Stroke Maternal Grandfather    Breast cancer Paternal Grandmother    Prostate cancer Paternal Grandfather    Cancer Maternal Uncle        type unknown   Social History: Kevin Newman lives in a house built 20 years ago without water damage, carpet in the bedroom, electric heating, central AC, dogs in the home fostered for 2 months, cats outside the home, no pests, using dust mite protection on bedding but not pillows, no smoke exposure.  Works as a Psychologist, occupational years.  No HEPA filter in the home.   ROS:  All other systems negative except as noted per HPI.  Objective:  Blood pressure 110/72, pulse 82, temperature 98 F  (36.7 C), temperature source Temporal, resp. rate 20, height 5\' 8"  (1.727 m), weight 276 lb 12.8 oz (125.6 kg), SpO2 96 %. Body mass index is 42.09 kg/m. Physical Exam:  General Appearance:  Alert, cooperative, no distress, appears stated age  Head:  Normocephalic, without obvious abnormality, atraumatic  Eyes:  Conjunctiva clear, EOM's intact  Nose: Nares normal, injected mucosa, slightly edematous turbinates, no visible polyps  Throat: Lips, tongue normal; teeth and gums normal  Neck: Supple, symmetrical  Lungs:   Clear to auscultation bilaterally, respirations unlabored, no coughing  Heart:  Regular rate and rhythm, no murmur, appears well perfused  Extremities: No edema  Skin: Skin color, texture, turgor normal, yellow scaling right paranasal skin  Neurologic: No gross deficits   Diagnostics: Skin Testing: Environmental allergy panel and select foods. Adequate positive and negative controls. Results discussed with patient/family.  Airborne Adult Perc - 01/01/21 1512     Time Antigen Placed 1512    Allergen Manufacturer Lavella Hammock    Location Back    Number of Test 59    Panel 1 Select    1. Control-Buffer 50% Glycerol Negative    2. Control-Histamine 1 mg/ml 3+    3. Albumin saline Negative    4. [place] Negative    5. [place] Negative    6. Johnson Negative    7. Blanchard Blue Negative    8. Meadow Fescue Negative    9. Perennial Rye Negative    10. Sweet Vernal Negative    11. Timothy Negative    12. Cocklebur Negative    13. Burweed Marshelder Negative    14. Ragweed, short Negative    15. Ragweed, Giant Negative    16. Plantain,  English Negative    17. Lamb's Quarters Negative    18. Sheep Sorrell Negative    19. Rough Pigweed Negative    20. Marsh Elder, Rough Negative    21. Mugwort, Common Negative    22. Ash mix Negative    23. Birch mix Negative    24. Beech American Negative    25. Box, Elder Negative    26. Cedar, red Negative    27. Cottonwood, [place]  Negative    28. Elm mix Negative    29. Hickory Negative    30. Maple mix Negative    31. Oak, [place] mix Negative    32. Pecan Pollen Negative    33. Pine mix Negative    34. Sycamore Eastern Negative    35. [place], Black Pollen Negative    36. Alternaria alternata Negative    37. Cladosporium Herbarum Negative    38. Aspergillus mix Negative    39. Penicillium mix Negative    40. Bipolaris sorokiniana (Helminthosporium) Negative    41. Drechslera spicifera (Curvularia) Negative  42. Mucor plumbeus Negative    43. Fusarium moniliforme Negative    44. Aureobasidium pullulans (pullulara) Negative    45. Rhizopus oryzae Negative    46. Botrytis cinera Negative    47. Epicoccum nigrum Negative    48. Phoma betae Negative    49. Candida Albicans Negative    50. Trichophyton mentagrophytes Negative    51. Mite, D Farinae  5,000 AU/ml Negative    52. Mite, D Pteronyssinus  5,000 AU/ml Negative    53. Cat Hair 10,000 BAU/ml Negative    54.  Dog Epithelia Negative    55. Mixed Feathers Negative    56. Horse Epithelia Negative    57. Cockroach, German Negative    58. Mouse Negative    59. Tobacco Leaf Negative             Food Perc - 01/01/21 1512       Test Information   Time Antigen Placed 1512    Allergen Manufacturer Lavella Hammock    Location Back    Number of allergen test [place]   1. Peanut Negative    2. Soybean food Negative    3. Wheat, whole Negative    4. Sesame Negative    5. Milk, cow Negative    6. Egg White, chicken Negative    7. Casein Negative    8. Shellfish mix Negative    9. Fish mix Negative    10. Cashew Negative             Intradermal - 01/01/21 1512     Time Antigen Placed 1512    Allergen Manufacturer Lavella Hammock    Location Arm    Number of Test 15    Intradermal Select    Control Negative    [place] 3+    Johnson Negative    7 Grass Negative    Ragweed mix Negative    Weed mix Negative    Tree mix Negative     Mold 1 Negative    Mold 2 2+    Mold 3 Negative    Mold 4 2+    Cat Negative    Dog Negative    Cockroach 3+    Mite mix Negative             Allergy testing results were read and interpreted by myself, documented by clinical staff.  Assessment and Plan   Patient Instructions  Chronic rhinitis -perennial allergic with likely vasomotor component: Uncontrolled - allergy testing today was positive to mold mix 2 and 4 (indoor molds) and cockroach on intradermal's only - allergen avoidance as below - consider allergy shots as long term control of your symptoms by teaching your immune system to be more tolerant of your allergy triggers - 1. Start Nasal Steroid Spray: Options include Flonase (fluticasone), Nasocort (triamcinolone), Nasonex (mometasome) 1- 2 sprays in each nostril daily (can buy over-the-counter if not covered by insurance)  Best results if used daily. - 2. If not controlled, Start Astelin (Azelastine) 1-2 sprays in each nostril twice a day as needed.  You may use this as needed for nasal congestion/itchy ears/itchy nose if desired - 3. If not controlled, Continue over the counter antihistamine daily or daily as needed.   -Your options include Zyrtec (Cetirizine) 10mg , Claritin (Loratadine) 10mg , Allegra (Fexofenadine) 180mg , or Xyzal (Levocetirinze) 5mg   Lactose Intolerance- - this is not an allergy but rather a problem with breaking down  dairy (due to an enzyme deficiency) which can lead to bloating, gas, diarrhea, nausea and vomiting - choose lactose free dairy or take lactaid prior to eating dairy products that contain lactose  Food Intolerance Plan - start an elimination diet by identifying any particular food or food group that may worsen your symptoms, eliminate these foods, and slowly try reintroducing after at least a 2-4 week period, if symptoms return, this is a food you should avoid - the symptoms you are having are not consistent with a life-threatening  food allergy, and there is no reliable allergy testing at this time that can identify food intolerances - food allergy testing today was negative to the top 8 most common allergens as well as barley and rye - consider GI and nutrition referral for help with underlying symptoms and diet   Follow-up in 3 months.   It was a pleasure meeting you in clinic today! If you receive a survey about today's experience, please consider giving [place] feedback on how we're doing!  Sigurd Sos, MD [facility]  This note in its entirety was forwarded to the Provider who requested this consultation.  Thank you for your kind referral. I appreciate the opportunity to take part in [place] care. Please do not hesitate to contact me with questions.  Sincerely,  Sigurd Sos, MD [facility]

## 2021-01-04 NOTE — Progress Notes (Signed)
VIALS NOT MADE. PT NOT SCHEDULED

## 2021-01-04 NOTE — Progress Notes (Signed)
Aeroallergen Immunotherapy   Ordering Provider: Dr. Sigurd Sos   Patient Details  Name: Kevin Newman  MRN: [id number]  Date of Birth: 1970/08/03   Order 1 of 1   Vial Label: m-CR   0.2 ml (Volume)  1:10 Concentration -- Aspergillus mix  0.2 ml (Volume)  1:10 Concentration -- Penicillium mix  0.2 ml (Volume)  1:10 Concentration -- Fusarium moniliforme  0.2 ml (Volume)  1:40 Concentration -- Aureobasidium pullulans  0.2 ml (Volume)  1:10 Concentration -- Rhizopus oryzae  0.3 ml (Volume)  1:20 Concentration -- Cockroach, German    1.3  ml Extract Subtotal  3.7  ml Diluent  5.0  ml Maintenance Total   Schedule:  B   Blue Vial (1:100,000): Schedule B (6 doses)  Yellow Vial (1:10,000): Schedule B (6 doses)  Green Vial (1:1,000): Schedule B (6 doses)  Red Vial (1:100): Schedule A (10 doses)   Special Instructions: Normal protocol

## 2021-01-12 DIAGNOSIS — F4321 Adjustment disorder with depressed mood: Secondary | ICD-10-CM | POA: Diagnosis not present

## 2021-01-27 ENCOUNTER — Encounter: Payer: Self-pay | Admitting: Family Medicine

## 2021-02-04 ENCOUNTER — Other Ambulatory Visit: Payer: Self-pay | Admitting: Family Medicine

## 2021-02-04 DIAGNOSIS — I10 Essential (primary) hypertension: Secondary | ICD-10-CM

## 2021-02-12 DIAGNOSIS — F4321 Adjustment disorder with depressed mood: Secondary | ICD-10-CM | POA: Diagnosis not present

## 2021-03-15 ENCOUNTER — Encounter: Payer: Self-pay | Admitting: Family Medicine

## 2021-03-15 MED ORDER — POTASSIUM CHLORIDE CRYS ER 10 MEQ PO TBCR: EXTENDED_RELEASE_TABLET | ORAL | 1 refills | Status: DC

## 2021-03-21 ENCOUNTER — Other Ambulatory Visit: Payer: Self-pay | Admitting: Family Medicine

## 2021-04-05 ENCOUNTER — Ambulatory Visit: Payer: BC Managed Care – PPO | Admitting: Internal Medicine

## 2021-04-07 DIAGNOSIS — F4321 Adjustment disorder with depressed mood: Secondary | ICD-10-CM | POA: Diagnosis not present

## 2021-04-19 ENCOUNTER — Ambulatory Visit: Payer: BC Managed Care – PPO | Admitting: Family Medicine

## 2021-04-28 DIAGNOSIS — F4321 Adjustment disorder with depressed mood: Secondary | ICD-10-CM | POA: Diagnosis not present

## 2021-05-24 ENCOUNTER — Ambulatory Visit: Payer: BC Managed Care – PPO | Admitting: Family Medicine

## 2021-05-24 ENCOUNTER — Encounter: Payer: Self-pay | Admitting: Family Medicine

## 2021-05-24 VITALS — BP 122/80 | HR 81 | Temp 98.1°F | Resp 16 | Ht 68.0 in | Wt 281.2 lb

## 2021-05-24 DIAGNOSIS — Z23 Encounter for immunization: Secondary | ICD-10-CM

## 2021-05-24 DIAGNOSIS — I10 Essential (primary) hypertension: Secondary | ICD-10-CM

## 2021-05-24 DIAGNOSIS — G4710 Hypersomnia, unspecified: Secondary | ICD-10-CM

## 2021-05-24 DIAGNOSIS — E782 Mixed hyperlipidemia: Secondary | ICD-10-CM

## 2021-05-24 MED ORDER — METOPROLOL SUCCINATE ER 25 MG PO TB24: 25.0000 mg | ORAL_TABLET | Freq: Every day | ORAL | 3 refills | Status: DC

## 2021-05-24 NOTE — Patient Instructions (Addendum)
Keep the diet clean and stay active. ? ?Give [place] 2-3 business days to get the results of your labs back.  ? ?Aim to do some physical exertion for 150 minutes per week. This is typically divided into 5 days per week, 30 minutes per day. The activity should be enough to get your heart rate up. Anything is better than nothing if you have time constraints. ? ?Please get me a copy of your advanced directive form at your convenience.  ? ?Let us know if you need anything. ?

## 2021-05-24 NOTE — Progress Notes (Signed)
Chief Complaint  ?Patient presents with  ? 6 months follow up  ? ? ?Subjective ?Kevin Newman is a 51 y.o. male who presents for hypertension follow up. ?He does not monitor home blood pressures. ?He is compliant with medications- Norvasc 5, chlorthalidone 25 mg/d, Toprol XL 25 mg/d. ?Patient has these side effects of medication: none ?He is usually adhering to a healthy diet overall. ?Current exercise: walking ?No Cp or SOB.  ? ?Mixed Hyperlipidemia ?Patient presents for mixed hyperlipidemia follow up. ?Currently being treated with Crestor 10 mg/d, Tricor 48 mg/d and compliance with treatment thus far has been good. ?He denies myalgias. ?Diet/exercise as above.  ?The patient is not known to have coexisting coronary artery disease. ? ?Seems to fall asleep easily, snores at night. Last sleep study showed borderline OSA 15ish years ago. Gets around 7-8 hrs sleep nightly.  ?  ?Past Medical History:  ?Diagnosis Date  ? Anxiety   ? Back pain   ? Constipation   ? Diverticulosis   ? Essential hypertension 04/22/2016  ? High cholesterol   ? Hypertension   ? Hypogonadism male 08/18/2016  ? Internal hemorrhoids   ? Sleep apnea   ? boderline, have CPAP  ? Swelling   ? ? ?Exam ?BP 122/80   Pulse 81   Temp 98.1 ?F (36.7 ?C)   Resp 16   Ht '5\' 8"'$  (1.727 m)   Wt 281 lb 3.2 oz (127.6 kg)   SpO2 98%   BMI 42.76 kg/m?  ?General:  well developed, well nourished, in no apparent distress ?Heart: RRR, no bruits, no LE edema ?Lungs: clear to auscultation, no accessory muscle use ?Psych: well oriented with normal range of affect and appropriate judgment/insight ? ?Essential hypertension ? ?Mixed hyperlipidemia - Plan: Comprehensive metabolic panel, Lipid panel ? ?Need for Tdap vaccination - Plan: Tdap vaccine greater than or equal to 7yo IM ? ?Excessive sleepiness - Plan: Ambulatory referral to Pulmonology ? ?Chronic, stable. Cont Norvasc 5 mg/d, chlorthalidone 25 mg/d, Toprol XL 25 mg/d. Counseled on diet and exercise. ?Chronic,  stable. Cont Crestor 10 mg/d, Tricor 48 mg/d.  ?Refer pulm for osa eval.  ?Tdap today.  ?Advanced directive form provided today.  ?F/u in 6 mo or prn. ?The patient voiced understanding and agreement to the plan. ? ?[name], DO ?05/24/21  ?4:34 PM ? ?

## 2021-05-27 ENCOUNTER — Other Ambulatory Visit (INDEPENDENT_AMBULATORY_CARE_PROVIDER_SITE_OTHER): Payer: BC Managed Care – PPO

## 2021-05-27 DIAGNOSIS — E782 Mixed hyperlipidemia: Secondary | ICD-10-CM

## 2021-05-27 LAB — COMPREHENSIVE METABOLIC PANEL
ALT: 11 U/L (ref 0–53)
AST: 18 U/L (ref 0–37)
Albumin: 4.6 g/dL (ref 3.5–5.2)
Alkaline Phosphatase: 55 U/L (ref 39–117)
BUN: 26 mg/dL — ABNORMAL HIGH (ref 6–23)
CO2: 31 mEq/L (ref 19–32)
Calcium: 9.8 mg/dL (ref 8.4–10.5)
Chloride: 99 mEq/L (ref 96–112)
Creatinine, Ser: 1.19 mg/dL (ref 0.40–1.50)
GFR: 72.03 mL/min (ref >60.00)
Glucose, Bld: 88 mg/dL (ref 70–99)
Potassium: 3.6 mEq/L (ref 3.5–5.1)
Sodium: 139 mEq/L (ref 135–145)
Total Bilirubin: 1.4 mg/dL — ABNORMAL HIGH (ref 0.2–1.2)
Total Protein: 6.9 g/dL (ref 6.0–8.3)

## 2021-05-27 LAB — LIPID PANEL
Cholesterol: 139 mg/dL (ref 0–200)
HDL: 55 mg/dL (ref >39.00)
LDL Cholesterol: 61 mg/dL (ref 0–99)
NonHDL: 83.92
Total CHOL/HDL Ratio: 3
Triglycerides: 116 mg/dL (ref 0.0–149.0)
VLDL: 23.2 mg/dL (ref 0.0–40.0)

## 2021-06-19 ENCOUNTER — Other Ambulatory Visit: Payer: Self-pay | Admitting: Family Medicine

## 2021-06-19 DIAGNOSIS — I10 Essential (primary) hypertension: Secondary | ICD-10-CM

## 2021-06-21 ENCOUNTER — Encounter: Payer: Self-pay | Admitting: Family Medicine

## 2021-06-21 ENCOUNTER — Ambulatory Visit (INDEPENDENT_AMBULATORY_CARE_PROVIDER_SITE_OTHER): Payer: BC Managed Care – PPO | Admitting: Adult Health

## 2021-06-21 DIAGNOSIS — G4733 Obstructive sleep apnea (adult) (pediatric): Secondary | ICD-10-CM

## 2021-06-21 DIAGNOSIS — I10 Essential (primary) hypertension: Secondary | ICD-10-CM

## 2021-06-21 NOTE — Patient Instructions (Signed)
Set  up for home sleep study  ?Work on healthy weight loss.  ?Do not drive if sleepy.  ?Follow up in 6-8 weeks to discuss results and treatment plan.  ? ?

## 2021-06-21 NOTE — Progress Notes (Signed)
? ?'@Patient'$  ID: Kevin Newman, male    DOB: 10-06-1970, 51 y.o.   MRN: [id number] ? ?Chief Complaint  ?Patient presents with  ? Consult  ? ? ?Referring provider: ?[name]* ? ?HPI: ?61 male seen for sleep consult June 21, 2021 for daytime sleepiness, snoring, restless sleep.  Carries a previous diagnosis of mild sleep apnea. ? ?TEST/EVENTS :  ? ? ?06/21/2021 Sleep consult  ?Patient presents today for a sleep consult.  He was kindly referred by his primary care provider Dr. Nani Ravens.  Patient complains of daytime sleepiness, snoring, restless sleep for several years.  Patient says he has been snoring for greater than 20 years.  Patient says he is very sleepy during the daytime.  Can fall asleep easily if he is watching TV.  Patient says he had a sleep study about 10 years ago and was told he had mild sleep apnea but did not start on CPAP.  Patient says he continues to have ongoing symptoms and wants to be checked to see if he still has sleep apnea typically goes to bed about 10 PM.  Gets up about 6 AM.  Does not take long to go to sleep at nighttime.  Takes in about 1 cup of coffee daily.  Does not use any type of energy drinks.  No symptoms suspicious for sleep paralysis or cataplexy.  Patient works in Engineer, technical sales.  And works from home.  Has no history of congestive heart failure or stroke.  Rarely takes a nap.  Epworth score is 13 out of 24.  Gets very sleepy with inactivity.  Such as watching TV or reading. ? ?Social history.  Patient is married.  Works full-time in Engineer, technical sales.  Works from home.  Has no children.  He is a never smoker.  No alcohol use. ? ?Family history positive for cancer. ? ?Past medical history significant for hyperlipidemia, hypertension, allergic rhinitis ? ?Surgical Hx : None  ? ? ? ?Allergies  ?Allergen Reactions  ? Amoxil [Amoxicillin]   ? Sulfa Antibiotics   ? ? ?Immunization History  ?Administered Date(s) Administered  ? Influenza,inj,Quad PF,6+ Mos 10/30/2015, 04/01/2020  ?  Influenza-Unspecified 12/05/2016, 12/18/2017  ? Tdap 05/24/2021  ? Tetanus 04/23/2011  ? ? ?Past Medical History:  ?Diagnosis Date  ? Anxiety   ? Back pain   ? Constipation   ? Diverticulosis   ? Essential hypertension 04/22/2016  ? High cholesterol   ? Hypertension   ? Hypogonadism male 08/18/2016  ? Internal hemorrhoids   ? Sleep apnea   ? boderline, have CPAP  ? Swelling   ? ? ?Tobacco History: ?Social History  ? ?Tobacco Use  ?Smoking Status Never  ?Smokeless Tobacco Never  ? ?Counseling given: Not Answered ? ? ?Outpatient Medications Prior to Visit  ?Medication Sig Dispense Refill  ? amLODipine (NORVASC) 5 MG tablet TAKE 1 TABLET(5 MG) BY MOUTH DAILY 90 tablet 3  ? azelastine (ASTELIN) 0.1 % nasal spray Place 1-2 sprays into both nostrils 2 (two) times daily as needed. 30 mL 5  ? chlorthalidone (HYGROTON) 25 MG tablet TAKE 1 TABLET(25 MG) BY MOUTH DAILY 90 tablet 1  ? fenofibrate (TRICOR) 48 MG tablet Take 1 tablet (48 mg total) by mouth daily. 90 tablet 2  ? loratadine (CLARITIN) 10 MG tablet     ? Magnesium 400 MG TABS Take 400 mg by mouth 2 (two) times daily.    ? metoprolol succinate (TOPROL-XL) 25 MG 24 hr tablet Take 1 tablet (25 mg total) by mouth  daily. 90 tablet 3  ? potassium chloride (KLOR-CON M) 10 MEQ tablet TAKE 2 TABLETS(20 MEQ) BY MOUTH DAILY 180 tablet 1  ? rosuvastatin (CRESTOR) 10 MG tablet TAKE 1 TABLET(10 MG) BY MOUTH DAILY 90 tablet 1  ? Vitamin D, Ergocalciferol, (DRISDOL) 1.25 MG (50000 UNIT) CAPS capsule Take 1 capsule (50,000 Units total) by mouth every 7 (seven) days. 4 capsule 0  ? vitamin E 1000 UNIT capsule Take 1,000 Units by mouth daily.    ? Multiple Vitamins-Minerals (MULTIVITAMIN ADULT PO) Take by mouth. (Patient not taking: Reported on 06/21/2021)    ? OVER THE COUNTER MEDICATION BIOTIC DETOXIFICATION SUPPORT.  Take 1 tablet daily (Patient not taking: Reported on 06/21/2021)    ? ?No facility-administered medications prior to visit.  ? ? ? ?Review of Systems:   ? ?Constitutional:   No  weight loss, night sweats,  Fevers, chills,  ?+fatigue, or  lassitude. ? ?HEENT:   No headaches,  Difficulty swallowing,  Tooth/dental problems, or  Sore throat,  ?              No sneezing, itching, ear ache, nasal congestion, post nasal drip,  ? ?CV:  No chest pain,  Orthopnea, PND, swelling in lower extremities, anasarca, dizziness, palpitations, syncope.  ? ?GI  No heartburn, indigestion, abdominal pain, nausea, vomiting, diarrhea, change in bowel habits, loss of appetite, bloody stools.  ? ?Resp: No shortness of breath with exertion or at rest.  No excess mucus, no productive cough,  No non-productive cough,  No coughing up of blood.  No change in color of mucus.  No wheezing.  No chest wall deformity ? ?Skin: no rash or lesions. ? ?GU: no dysuria, change in color of urine, no urgency or frequency.  No flank pain, no hematuria  ? ?MS:  No joint pain or swelling.  No decreased range of motion.  No back pain. ? ? ? ?Physical Exam ? ?SpO2 98%  ? ?GEN: A/Ox3; pleasant , NAD, well nourished  ?  ?HEENT:  [place]/AT,   NOSE-clear, THROAT-clear, no lesions, no postnasal drip or exudate noted. Class 2-3 MP  ? ?NECK:  Supple w/ fair ROM; no JVD; normal carotid impulses w/o bruits; no thyromegaly or nodules palpated; no lymphadenopathy.   ? ?RESP  Clear  P & A; w/o, wheezes/ rales/ or rhonchi. no accessory muscle use, no dullness to percussion ? ?CARD:  RRR, no m/r/g, no peripheral edema, pulses intact, no cyanosis or clubbing. ? ?GI:   Soft & nt; nml bowel sounds; no organomegaly or masses detected.  ? ?Musco: Warm bil, no deformities or joint swelling noted.  ? ?Neuro: alert, no focal deficits noted.   ? ?Skin: Warm, no lesions or rashes ? ? ? ?Lab Results: ? ? ? ?BNP ?No results found for: BNP ? ?ProBNP ?No results found for: PROBNP ? ?Imaging: ?No results found. ? ? ? ? ? ? ?Assessment & Plan:  ? ?No problem-specific Assessment & Plan notes found for this encounter. ? ? ? ? ?Rexene Edison,  NP ?06/21/2021 ? ?

## 2021-06-22 DIAGNOSIS — L821 Other seborrheic keratosis: Secondary | ICD-10-CM | POA: Diagnosis not present

## 2021-06-22 DIAGNOSIS — D2271 Melanocytic nevi of right lower limb, including hip: Secondary | ICD-10-CM | POA: Diagnosis not present

## 2021-06-22 DIAGNOSIS — L578 Other skin changes due to chronic exposure to nonionizing radiation: Secondary | ICD-10-CM | POA: Diagnosis not present

## 2021-06-22 DIAGNOSIS — L219 Seborrheic dermatitis, unspecified: Secondary | ICD-10-CM | POA: Diagnosis not present

## 2021-06-22 MED ORDER — CHLORTHALIDONE 25 MG PO TABS: ORAL_TABLET | ORAL | 1 refills | Status: DC

## 2021-06-25 NOTE — Assessment & Plan Note (Signed)
Healthy weight loss 

## 2021-06-25 NOTE — Assessment & Plan Note (Signed)
Previous diagnosis of sleep apnea.  Patient has ongoing symptoms with snoring, restless sleep, daytime sleepiness and morbid obesity with current weight of 281 pounds.  All suspicious for underlying sleep apnea.  We will set patient up for home sleep study.  Patient education on sleep apnea ? ?- discussed how weight can impact sleep and risk for sleep disordered breathing ?- discussed options to assist with weight loss: combination of diet modification, cardiovascular and strength training exercises ?  ?- had an extensive discussion regarding the adverse health consequences related to untreated sleep disordered breathing ?- specifically discussed the risks for hypertension, coronary artery disease, cardiac dysrhythmias, cerebrovascular disease, and diabetes ?- lifestyle modification discussed ?  ?- discussed how sleep disruption can increase risk of accidents, particularly when driving ?- safe driving practices were discussed ?  ?Plan  ?Patient Instructions  ?Set  up for home sleep study  ?Work on healthy weight loss.  ?Do not drive if sleepy.  ?Follow up in 6-8 weeks to discuss results and treatment plan.  ? ?  ? ?

## 2021-08-06 DIAGNOSIS — F4321 Adjustment disorder with depressed mood: Secondary | ICD-10-CM | POA: Diagnosis not present

## 2021-08-13 ENCOUNTER — Ambulatory Visit: Payer: BC Managed Care – PPO

## 2021-08-13 DIAGNOSIS — G4733 Obstructive sleep apnea (adult) (pediatric): Secondary | ICD-10-CM

## 2021-08-19 ENCOUNTER — Telehealth: Payer: Self-pay | Admitting: Pulmonary Disease

## 2021-08-19 DIAGNOSIS — G4733 Obstructive sleep apnea (adult) (pediatric): Secondary | ICD-10-CM

## 2021-08-19 NOTE — Telephone Encounter (Signed)
Call patient  Sleep study result  Date of study: 08/15/2021  Impression: Severe obstructive sleep apnea Moderate oxygen desaturations  Recommendation: DME referral  Recommend CPAP therapy for severe obstructive sleep apnea  Auto titrating CPAP with pressure settings of 5-20 will be appropriate  Encourage weight loss measures  Follow-up in the office 4 to 6 weeks following initiation of treatment

## 2021-08-30 ENCOUNTER — Telehealth: Payer: Self-pay | Admitting: Adult Health

## 2021-08-30 NOTE — Telephone Encounter (Signed)
Response from Tammy:  sorry thought this was my patient was just following up on results, just make sure they got CPAP and needs ov 2 months after set up.  ATC patient to let him know that he does not need to keep OV that was scheduled. Left detailed message letting him know that I have cancelled appt and that once he gets his CPAP machine to call the office and schedule an OV within 31-90 days. Nothing further needed at this time.

## 2021-08-30 NOTE — Telephone Encounter (Signed)
Home sleep study done on August 15, 2021 shows severe sleep apnea with AHI at 30.9/hour and SPO2 low at 75%.  Please set up office visit to discuss sleep study results and treatment plan

## 2021-08-30 NOTE — Telephone Encounter (Signed)
-----   Message from Donne Hazel sent at 08/20/2021  9:26 AM EDT ----- Hello Ms Kevin Newman  this patient's HST report is ready for review

## 2021-08-30 NOTE — Telephone Encounter (Signed)
ATC x1.  LVM to return call. 

## 2021-08-30 NOTE — Telephone Encounter (Signed)
Called and spoke with patient to go over sleep study results. He stated that someone called him about a week ago with same results and told him they were going to order him a CPAP. Looks like message dated 08/19/21 Barnet Pall talked to patient and placed order for CPAP. Patient has been scheduled with Tammy on 09/16/21.   Tammy does he need to keep this appointment if CPAP has already been ordered?

## 2021-09-02 NOTE — Telephone Encounter (Signed)
Recall placed for f/u after CPAP started.

## 2021-09-04 ENCOUNTER — Other Ambulatory Visit: Payer: Self-pay | Admitting: Family Medicine

## 2021-09-06 ENCOUNTER — Other Ambulatory Visit: Payer: Self-pay | Admitting: Family Medicine

## 2021-09-06 DIAGNOSIS — E782 Mixed hyperlipidemia: Secondary | ICD-10-CM

## 2021-09-06 DIAGNOSIS — F4321 Adjustment disorder with depressed mood: Secondary | ICD-10-CM | POA: Diagnosis not present

## 2021-09-06 MED ORDER — POTASSIUM CHLORIDE CRYS ER 10 MEQ PO TBCR: 10.0000 meq | EXTENDED_RELEASE_TABLET | Freq: Two times a day (BID) | ORAL | 1 refills | Status: DC

## 2021-09-06 MED ORDER — FENOFIBRATE 48 MG PO TABS: 48.0000 mg | ORAL_TABLET | Freq: Every day | ORAL | 2 refills | Status: DC

## 2021-09-16 ENCOUNTER — Ambulatory Visit: Payer: BC Managed Care – PPO | Admitting: Adult Health

## 2021-09-16 DIAGNOSIS — G4733 Obstructive sleep apnea (adult) (pediatric): Secondary | ICD-10-CM | POA: Diagnosis not present

## 2021-09-21 NOTE — Telephone Encounter (Signed)
Patient scheduled for f/u on 8/25 at 3:30 pm.  Nothing further needed.

## 2021-10-05 DIAGNOSIS — F4321 Adjustment disorder with depressed mood: Secondary | ICD-10-CM | POA: Diagnosis not present

## 2021-10-06 ENCOUNTER — Encounter (INDEPENDENT_AMBULATORY_CARE_PROVIDER_SITE_OTHER): Payer: Self-pay

## 2021-10-17 DIAGNOSIS — G4733 Obstructive sleep apnea (adult) (pediatric): Secondary | ICD-10-CM | POA: Diagnosis not present

## 2021-10-21 DIAGNOSIS — F4321 Adjustment disorder with depressed mood: Secondary | ICD-10-CM | POA: Diagnosis not present

## 2021-10-22 ENCOUNTER — Encounter: Payer: Self-pay | Admitting: Adult Health

## 2021-10-22 ENCOUNTER — Ambulatory Visit: Payer: BC Managed Care – PPO | Admitting: Adult Health

## 2021-10-22 VITALS — BP 126/82 | HR 78 | Ht 68.0 in | Wt 292.0 lb

## 2021-10-22 DIAGNOSIS — G4733 Obstructive sleep apnea (adult) (pediatric): Secondary | ICD-10-CM

## 2021-10-22 NOTE — Assessment & Plan Note (Signed)
Healthy weight loss discussed 

## 2021-10-22 NOTE — Assessment & Plan Note (Signed)
Excellent control compliance on CPAP.  We will adjust CPAP pressure for comfort.  Decrease CPAP AutoSet to 5 to 15 cm H2O.  Plan  Patient Instructions  Kevin Newman job keep up the good work Continue to wear CPAP all night long and with naps Decrease CPAP pressure -5 to 15 cm .  Work on healthy weight loss.  Do not drive if sleepy.  Follow up in 4-6 months and As needed

## 2021-10-22 NOTE — Progress Notes (Signed)
$'@Patient'J$  ID: Kevin Newman, male    DOB: 22-Feb-1971, 51 y.o.   MRN: [id number]  Chief Complaint  Patient presents with   Follow-up    Referring provider: [name]*  HPI: 51 year old male seen for sleep consult June 21, 2021 for daytime sleepiness, snoring, restless sleep found to have severe sleep apnea  TEST/EVENTS :  Home sleep study done on August 15, 2021 shows severe sleep apnea with AHI at 30.9/hour and SPO2 low at 75%.  10/22/2021 Follow up: OSA  Patient presents for a 12-monthfollow-up.  Patient was seen in April for a sleep consult.  He was having daytime sleepiness, snoring and restless sleep.  Patient had been diagnosed with sleep apnea over 10 years ago.  And was told he had mild sleep apnea did not start a CPAP machine.  He was set up for a home sleep study that was done August 15, 2021 that showed severe sleep apnea with AHI at 30/hour and SPO2 low at 75%.  Patient was recommended to start on CPAP.  Patient says that he has started on CPAP and feels a lot better.  Feels that he benefits from CPAP.  With decreased daytime sleepiness and restless sleep.  CPAP download shows excellent compliance with 97% usage.  Daily average usage at 6 hours.  Patient is on auto CPAP 5 to 20 cm H2O.  Daily average pressure at 10 cm H2O.  AHI 1.6/hour.  Is using the nasal mask.   Allergies  Allergen Reactions   Amoxil [Amoxicillin]    Sulfa Antibiotics     Immunization History  Administered Date(s) Administered   Influenza,inj,Quad PF,6+ Mos 10/30/2015, 04/01/2020   Influenza-Unspecified 12/05/2016, 12/18/2017   Tdap 05/24/2021   Tetanus 04/23/2011    Past Medical History:  Diagnosis Date   Anxiety    Back pain    Constipation    Diverticulosis    Essential hypertension 04/22/2016   High cholesterol    Hypertension    Hypogonadism male 08/18/2016   Internal hemorrhoids    Sleep apnea    boderline, have CPAP   Swelling     Tobacco History: Social History    Tobacco Use  Smoking Status Never  Smokeless Tobacco Never   Counseling given: Not Answered   Outpatient Medications Prior to Visit  Medication Sig Dispense Refill   amLODipine (NORVASC) 5 MG tablet TAKE 1 TABLET(5 MG) BY MOUTH DAILY 90 tablet 3   azelastine (ASTELIN) 0.1 % nasal spray Place 1-2 sprays into both nostrils 2 (two) times daily as needed. 30 mL 5   chlorthalidone (HYGROTON) 25 MG tablet TAKE 1 TABLET(25 MG) BY MOUTH DAILY 90 tablet 1   fenofibrate (TRICOR) 48 MG tablet Take 1 tablet (48 mg total) by mouth daily. 90 tablet 2   loratadine (CLARITIN) 10 MG tablet      Magnesium 400 MG TABS Take 400 mg by mouth 2 (two) times daily.     metoprolol succinate (TOPROL-XL) 25 MG 24 hr tablet Take 1 tablet (25 mg total) by mouth daily. 90 tablet 3   Multiple Vitamins-Minerals (MULTIVITAMIN ADULT PO) Take by mouth.     OVER THE COUNTER MEDICATION BIOTIC DETOXIFICATION SUPPORT.  Take 1 tablet daily     potassium chloride (KLOR-CON M) 10 MEQ tablet Take 1 tablet (10 mEq total) by mouth 2 (two) times daily. 180 tablet 1   rosuvastatin (CRESTOR) 10 MG tablet TAKE 1 TABLET(10 MG) BY MOUTH DAILY 90 tablet 1   Vitamin D, Ergocalciferol, (DRISDOL)  1.25 MG (50000 UNIT) CAPS capsule Take 1 capsule (50,000 Units total) by mouth every 7 (seven) days. 4 capsule 0   vitamin E 1000 UNIT capsule Take 1,000 Units by mouth daily.     No facility-administered medications prior to visit.     Review of Systems:   Constitutional:   No  weight loss, night sweats,  Fevers, chills, fatigue, or  lassitude.  HEENT:   No headaches,  Difficulty swallowing,  Tooth/dental problems, or  Sore throat,                No sneezing, itching, ear ache, nasal congestion, post nasal drip,   CV:  No chest pain,  Orthopnea, PND, swelling in lower extremities, anasarca, dizziness, palpitations, syncope.   GI  No heartburn, indigestion, abdominal pain, nausea, vomiting, diarrhea, change in bowel habits, loss of  appetite, bloody stools.   Resp: No shortness of breath with exertion or at rest.  No excess mucus, no productive cough,  No non-productive cough,  No coughing up of blood.  No change in color of mucus.  No wheezing.  No chest wall deformity  Skin: no rash or lesions.  GU: no dysuria, change in color of urine, no urgency or frequency.  No flank pain, no hematuria   MS:  No joint pain or swelling.  No decreased range of motion.  No back pain.    Physical Exam  BP 126/82 (BP Location: Left Arm, Cuff Size: Normal)   Pulse 78   Ht '5\' 8"'$  (1.727 m)   Wt 292 lb (132.5 kg)   SpO2 97%   BMI 44.40 kg/m   GEN: A/Ox3; pleasant , NAD, well nourished    HEENT:  [place]/AT,   NOSE-clear, THROAT-clear, no lesions, no postnasal drip or exudate noted. Class 3  MP airway   NECK:  Supple w/ fair ROM; no JVD; normal carotid impulses w/o bruits; no thyromegaly or nodules palpated; no lymphadenopathy.    RESP  Clear  P & A; w/o, wheezes/ rales/ or rhonchi. no accessory muscle use, no dullness to percussion  CARD:  RRR, no m/r/g, no peripheral edema, pulses intact, no cyanosis or clubbing.  GI:   Soft & nt; nml bowel sounds; no organomegaly or masses detected.   Musco: Warm bil, no deformities or joint swelling noted.   Neuro: alert, no focal deficits noted.    Skin: Warm, no lesions or rashes    Lab Results:      BNP No results found for: "BNP"  ProBNP No results found for: "PROBNP"  Imaging: No results found.        No data to display          No results found for: "NITRICOXIDE"      Assessment & Plan:   No problem-specific Assessment & Plan notes found for this encounter.     Kevin Edison, NP 10/22/2021

## 2021-10-22 NOTE — Patient Instructions (Signed)
Great job keep up the good work Continue to wear CPAP all night long and with naps Decrease CPAP pressure -5 to 15 cm .  Work on healthy weight loss.  Do not drive if sleepy.  Follow up in 4-6 months and As needed

## 2021-10-24 NOTE — Progress Notes (Signed)
Reviewed and agree with assessment/plan.   Chesley Mires, MD [facility]Critical [facility] 10/24/2021, 7:27 AM Pager:  [phone]

## 2021-11-17 DIAGNOSIS — G4733 Obstructive sleep apnea (adult) (pediatric): Secondary | ICD-10-CM | POA: Diagnosis not present

## 2021-11-26 ENCOUNTER — Encounter: Payer: Self-pay | Admitting: Family Medicine

## 2021-11-26 ENCOUNTER — Ambulatory Visit (INDEPENDENT_AMBULATORY_CARE_PROVIDER_SITE_OTHER): Payer: BC Managed Care – PPO | Admitting: Family Medicine

## 2021-11-26 VITALS — BP 110/80 | HR 63 | Temp 98.0°F | Ht 68.0 in | Wt 292.1 lb

## 2021-11-26 DIAGNOSIS — Z0000 Encounter for general adult medical examination without abnormal findings: Secondary | ICD-10-CM | POA: Diagnosis not present

## 2021-11-26 DIAGNOSIS — G8929 Other chronic pain: Secondary | ICD-10-CM

## 2021-11-26 DIAGNOSIS — M79671 Pain in right foot: Secondary | ICD-10-CM

## 2021-11-26 DIAGNOSIS — Z125 Encounter for screening for malignant neoplasm of prostate: Secondary | ICD-10-CM | POA: Diagnosis not present

## 2021-11-26 DIAGNOSIS — Z23 Encounter for immunization: Secondary | ICD-10-CM

## 2021-11-26 LAB — COMPREHENSIVE METABOLIC PANEL
ALT: 10 U/L (ref 0–53)
AST: 19 U/L (ref 0–37)
Albumin: 4.3 g/dL (ref 3.5–5.2)
Alkaline Phosphatase: 48 U/L (ref 39–117)
BUN: 23 mg/dL (ref 6–23)
CO2: 30 mEq/L (ref 19–32)
Calcium: 9.3 mg/dL (ref 8.4–10.5)
Chloride: 100 mEq/L (ref 96–112)
Creatinine, Ser: 1.15 mg/dL (ref 0.40–1.50)
GFR: 74.78 mL/min (ref >60.00)
Glucose, Bld: 89 mg/dL (ref 70–99)
Potassium: 3.5 mEq/L (ref 3.5–5.1)
Sodium: 139 mEq/L (ref 135–145)
Total Bilirubin: 1.3 mg/dL — ABNORMAL HIGH (ref 0.2–1.2)
Total Protein: 6.6 g/dL (ref 6.0–8.3)

## 2021-11-26 LAB — LIPID PANEL
Cholesterol: 127 mg/dL (ref 0–200)
HDL: 51.3 mg/dL (ref >39.00)
LDL Cholesterol: 50 mg/dL (ref 0–99)
NonHDL: 75.6
Total CHOL/HDL Ratio: 2
Triglycerides: 126 mg/dL (ref 0.0–149.0)
VLDL: 25.2 mg/dL (ref 0.0–40.0)

## 2021-11-26 LAB — CBC
HCT: 46.4 % (ref 39.0–52.0)
Hemoglobin: 15.7 g/dL (ref 13.0–17.0)
MCHC: 33.9 g/dL (ref 30.0–36.0)
MCV: 92.9 fl (ref 78.0–100.0)
Platelets: 214 10*3/uL (ref 150.0–400.0)
RBC: 4.99 Mil/uL (ref 4.22–5.81)
RDW: 12.8 % (ref 11.5–15.5)
WBC: 5.1 10*3/uL (ref 4.0–10.5)

## 2021-11-26 LAB — PSA: PSA: 0.47 ng/mL (ref 0.10–4.00)

## 2021-11-26 NOTE — Addendum Note (Signed)
Addended by: Sharon Seller B on: 11/26/2021 09:07 AM   Modules accepted: Orders

## 2021-11-26 NOTE — Patient Instructions (Signed)
Give us 2-3 business days to get the results of your labs back.   Keep the diet clean and stay active.  Please get me a copy of your advanced directive form at your convenience.   Let us know if you need anything.  

## 2021-11-26 NOTE — Progress Notes (Signed)
Chief Complaint  Patient presents with   Annual Exam   Foot Pain    Right     Well Male Kevin Newman is here for a complete physical.   His last physical was >1 year ago.  Current diet: in general, a "healthy" diet.   Current exercise: none lately Weight trend: stable Fatigue out of ordinary? No. Seat belt? Yes.   Advanced directive? No  Health maintenance Tetanus- Yes HIV- Yes Hep C- Yes  R foot pain Over past several years, has had pain under the arch of his foot. Usually when he stands for periods of time. He went to a podiatrist and had custom orthotics. No bruising, redness, swelling. Goes away quickly when he sits. Not particularly painful during first steps in AM.   Past Medical History:  Diagnosis Date   Anxiety    Back pain    Constipation    Diverticulosis    Essential hypertension 04/22/2016   High cholesterol    Hypertension    Hypogonadism male 08/18/2016   Internal hemorrhoids    Sleep apnea    boderline, have CPAP   Swelling      Past Surgical History:  Procedure Laterality Date   NO PAST SURGERIES      Medications  Current Outpatient Medications on File Prior to Visit  Medication Sig Dispense Refill   amLODipine (NORVASC) 5 MG tablet TAKE 1 TABLET(5 MG) BY MOUTH DAILY 90 tablet 3   azelastine (ASTELIN) 0.1 % nasal spray Place 1-2 sprays into both nostrils 2 (two) times daily as needed. 30 mL 5   chlorthalidone (HYGROTON) 25 MG tablet TAKE 1 TABLET(25 MG) BY MOUTH DAILY 90 tablet 1   fenofibrate (TRICOR) 48 MG tablet Take 1 tablet (48 mg total) by mouth daily. 90 tablet 2   loratadine (CLARITIN) 10 MG tablet      Magnesium 400 MG TABS Take 400 mg by mouth 2 (two) times daily.     metoprolol succinate (TOPROL-XL) 25 MG 24 hr tablet Take 1 tablet (25 mg total) by mouth daily. 90 tablet 3   Multiple Vitamins-Minerals (MULTIVITAMIN ADULT PO) Take by mouth.     OVER THE COUNTER MEDICATION BIOTIC DETOXIFICATION SUPPORT.  Take 1 tablet daily      potassium chloride (KLOR-CON M) 10 MEQ tablet Take 1 tablet (10 mEq total) by mouth 2 (two) times daily. 180 tablet 1   rosuvastatin (CRESTOR) 10 MG tablet TAKE 1 TABLET(10 MG) BY MOUTH DAILY 90 tablet 1   Vitamin D, Ergocalciferol, (DRISDOL) 1.25 MG (50000 UNIT) CAPS capsule Take 1 capsule (50,000 Units total) by mouth every 7 (seven) days. 4 capsule 0   vitamin E 1000 UNIT capsule Take 1,000 Units by mouth daily.      Allergies Allergies  Allergen Reactions   Amoxil [Amoxicillin]    Sulfa Antibiotics     Family History Family History  Problem Relation Age of Onset   Hypertension Mother    Thyroid disease Mother    Gallbladder disease Mother    Gallbladder disease Father    Heart Problems Father    Heart disease Maternal Grandmother 71   Heart attack Maternal Grandfather    Stroke Maternal Grandfather    Breast cancer Paternal Grandmother    Prostate cancer Paternal Grandfather    Cancer Maternal Uncle        type unknown    Review of Systems: Constitutional: no fevers or chills Eye:  no recent significant change in vision Ear/Nose/Mouth/Throat:  Ears:  no hearing loss Nose/Mouth/Throat:  no complaints of nasal congestion, no sore throat Cardiovascular:  no chest pain Respiratory:  no shortness of breath Gastrointestinal:  no abdominal pain, no change in bowel habits GU:  Male: negative for dysuria, frequency, and incontinence Musculoskeletal/Extremities: +R foot pain Integumentary (Skin/Breast):  no abnormal skin lesions reported Neurologic:  no headaches Endocrine: No unexpected weight changes Hematologic/Lymphatic:  no night sweats  Exam BP 110/80 (BP Location: Left Arm, Patient Position: Sitting, Cuff Size: Large)   Pulse 63   Temp 98 F (36.7 C) (Oral)   Ht '5\' 8"'$  (1.727 m)   Wt 292 lb 2 oz (132.5 kg)   SpO2 95%   BMI 44.42 kg/m  General:  well developed, well nourished, in no apparent distress Skin:  no significant moles, warts, or growths Head:  no  masses, lesions, or tenderness Eyes:  pupils equal and round, sclera anicteric without injection Ears:  canals without lesions, TMs shiny without retraction, no obvious effusion, no erythema Nose:  nares patent, mucosa normal Throat/Pharynx:  lips and gingiva without lesion; tongue and uvula midline; non-inflamed pharynx; no exudates or postnasal drainage Neck: neck supple without adenopathy, thyromegaly, or masses Lungs:  clear to auscultation, breath sounds equal bilaterally, no respiratory distress Cardio:  regular rate and rhythm, no bruits, no LE edema Abdomen:  abdomen soft, nontender; bowel sounds normal; no masses or organomegaly Rectal: Deferred Musculoskeletal: TTP over plantar mid foot, no deformity, excessive warmth, bony ttp, ecchymosis, symmetrical muscle groups noted without atrophy or deformity Extremities:  no clubbing, cyanosis, or edema, no deformities, no skin discoloration Neuro:  gait normal; deep tendon reflexes normal and symmetric Psych: well oriented with normal range of affect and appropriate judgment/insight  Assessment and Plan  Well adult exam - Plan: CBC, Comprehensive metabolic panel, Lipid panel  Screening for prostate cancer - Plan: PSA  Chronic foot pain, right - Plan: Ambulatory referral to Sports Medicine   Well 51 y.o. male. Counseled on diet and exercise. Counseled on risks and benefits of prostate cancer screening with PSA. The patient agrees to undergo screening.  Foot pain: Tylenol, ice, heat, refer sports med for further eval. May need to update orthotics. Wt loss encouraged.  Flu shot today.  Advanced directive form requested today.  Other orders as above. Follow up in 6 mo pending the above workup. The patient voiced understanding and agreement to the plan.  [place], DO 11/26/21 8:38 AM

## 2021-12-01 ENCOUNTER — Ambulatory Visit: Payer: BC Managed Care – PPO | Admitting: Family Medicine

## 2021-12-01 ENCOUNTER — Ambulatory Visit: Payer: Self-pay

## 2021-12-01 VITALS — BP 135/90 | Ht 68.0 in | Wt 290.0 lb

## 2021-12-01 DIAGNOSIS — M722 Plantar fascial fibromatosis: Secondary | ICD-10-CM | POA: Diagnosis not present

## 2021-12-01 DIAGNOSIS — M79671 Pain in right foot: Secondary | ICD-10-CM

## 2021-12-01 NOTE — Patient Instructions (Signed)
Nice to meet you Please use insoles  Please try the compression  Please try the exercises and ice   Please send me a message in MyChart with any questions or updates.  Please see me back in 4 weeks.   --Dr. Raeford Razor

## 2021-12-01 NOTE — Assessment & Plan Note (Signed)
Acute on chronic in nature.  Fibroma appreciated on ultrasound. -Counseled on home exercise therapy and supportive care. -Green sport insoles with scaphoid pads . -Midfoot arch strap. -Consider physical therapy, custom orthotics or shockwave therapy

## 2021-12-01 NOTE — Progress Notes (Unsigned)
  Kevin Newman - 51 y.o. male MRN [id number]  Date of birth: 03-10-1970  SUBJECTIVE:  Including CC & ROS.  No chief complaint on file.   Kevin Newman is a 51 y.o. male that is presenting with acute on chronic right arch pain.  The pain is worse with any prolonged standing.  It is occurring in the arch.  No history of injury or inciting event.    Review of Systems See HPI   HISTORY: Past Medical, Surgical, Social, and Family History Reviewed & Updated per EMR.   Pertinent Historical Findings include:  Past Medical History:  Diagnosis Date   Anxiety    Back pain    Constipation    Diverticulosis    Essential hypertension 04/22/2016   High cholesterol    Hypertension    Hypogonadism male 08/18/2016   Internal hemorrhoids    Sleep apnea    boderline, have CPAP   Swelling     Past Surgical History:  Procedure Laterality Date   NO PAST SURGERIES       PHYSICAL EXAM:  VS: BP (!) 135/90   Ht '5\' 8"'$  (1.727 m)   Wt 290 lb (131.5 kg)   BMI 44.09 kg/m  Physical Exam Gen: NAD, alert, cooperative with exam, well-appearing MSK:  Neurovascularly intact    Limited ultrasound: Right foot:  Normal appearing plantar fascia at the calcaneus. Mild thickening of the mid substance of the plantar fascia consistent with fibroma. No hyperemia appreciated  Summary: Findings consistent with plantar fibroma  Ultrasound and interpretation by Clearance Coots, MD    ASSESSMENT & PLAN:   Plantar fascial fibromatosis of right foot Acute on chronic in nature.  Fibroma appreciated on ultrasound. -Counseled on home exercise therapy and supportive care. -Green sport insoles with scaphoid pads . -Midfoot arch strap. -Consider physical therapy, custom orthotics or shockwave therapy

## 2021-12-03 ENCOUNTER — Other Ambulatory Visit: Payer: Self-pay | Admitting: Family Medicine

## 2021-12-03 DIAGNOSIS — I10 Essential (primary) hypertension: Secondary | ICD-10-CM

## 2021-12-03 MED ORDER — CHLORTHALIDONE 25 MG PO TABS: ORAL_TABLET | ORAL | 3 refills | Status: DC

## 2021-12-10 ENCOUNTER — Other Ambulatory Visit: Payer: Self-pay | Admitting: Family Medicine

## 2021-12-10 DIAGNOSIS — I10 Essential (primary) hypertension: Secondary | ICD-10-CM

## 2021-12-10 MED ORDER — AMLODIPINE BESYLATE 5 MG PO TABS: ORAL_TABLET | ORAL | 3 refills | Status: DC

## 2021-12-17 DIAGNOSIS — G4733 Obstructive sleep apnea (adult) (pediatric): Secondary | ICD-10-CM | POA: Diagnosis not present

## 2021-12-20 ENCOUNTER — Encounter: Payer: Self-pay | Admitting: Family Medicine

## 2021-12-21 ENCOUNTER — Ambulatory Visit: Payer: BC Managed Care – PPO | Admitting: Family Medicine

## 2021-12-30 DIAGNOSIS — F4321 Adjustment disorder with depressed mood: Secondary | ICD-10-CM | POA: Diagnosis not present

## 2022-01-10 DIAGNOSIS — G4733 Obstructive sleep apnea (adult) (pediatric): Secondary | ICD-10-CM | POA: Diagnosis not present

## 2022-01-12 ENCOUNTER — Ambulatory Visit: Payer: BC Managed Care – PPO | Admitting: Family Medicine

## 2022-01-17 DIAGNOSIS — G4733 Obstructive sleep apnea (adult) (pediatric): Secondary | ICD-10-CM | POA: Diagnosis not present

## 2022-01-19 ENCOUNTER — Ambulatory Visit: Payer: BC Managed Care – PPO | Admitting: Family Medicine

## 2022-01-26 ENCOUNTER — Encounter: Payer: Self-pay | Admitting: Family Medicine

## 2022-01-26 ENCOUNTER — Ambulatory Visit: Payer: BC Managed Care – PPO | Admitting: Family Medicine

## 2022-01-26 VITALS — BP 130/84 | Ht 68.0 in | Wt 290.0 lb

## 2022-01-26 DIAGNOSIS — M722 Plantar fascial fibromatosis: Secondary | ICD-10-CM

## 2022-01-26 NOTE — Patient Instructions (Signed)
Good to see you Please continue the exercises  Please use ice as needed  Please send me a message in MyChart with any questions or updates.  Please see me back to have custom orthotics made.   --Dr. Raeford Razor

## 2022-01-26 NOTE — Progress Notes (Signed)
  Kevin Newman - 51 y.o. male MRN [id number]  Date of birth: December 06, 1970  SUBJECTIVE:  Including CC & ROS.  No chief complaint on file.   Kevin Newman is a 51 y.o. male that is following up for his foot pain.  His foot pain is staying about the same.  No worsening of the pain.  Still experiences the pain if he stands for long periods of time.    Review of Systems See HPI   HISTORY: Past Medical, Surgical, Social, and Family History Reviewed & Updated per EMR.   Pertinent Historical Findings include:  Past Medical History:  Diagnosis Date   Anxiety    Back pain    Constipation    Diverticulosis    Essential hypertension 04/22/2016   High cholesterol    Hypertension    Hypogonadism male 08/18/2016   Internal hemorrhoids    Sleep apnea    boderline, have CPAP   Swelling     Past Surgical History:  Procedure Laterality Date   NO PAST SURGERIES       PHYSICAL EXAM:  VS: BP 130/84   Ht '5\' 8"'$  (1.727 m)   Wt 290 lb (131.5 kg)   BMI 44.09 kg/m  Physical Exam Gen: NAD, alert, cooperative with exam, well-appearing MSK:  Neurovascularly intact       ASSESSMENT & PLAN:   Plantar fascial fibromatosis of right foot Continues to have pain.  No worsening of the pain. -Counseled on home exercise therapy and supportive care -Pursue custom orthotics. -Could consider physical therapy or injection

## 2022-01-26 NOTE — Assessment & Plan Note (Signed)
Continues to have pain.  No worsening of the pain. -Counseled on home exercise therapy and supportive care -Pursue custom orthotics. -Could consider physical therapy or injection

## 2022-02-16 DIAGNOSIS — G4733 Obstructive sleep apnea (adult) (pediatric): Secondary | ICD-10-CM | POA: Diagnosis not present

## 2022-02-17 DIAGNOSIS — F4321 Adjustment disorder with depressed mood: Secondary | ICD-10-CM | POA: Diagnosis not present

## 2022-03-03 ENCOUNTER — Other Ambulatory Visit: Payer: Self-pay | Admitting: Family Medicine

## 2022-03-07 ENCOUNTER — Encounter: Payer: BC Managed Care – PPO | Admitting: Family Medicine

## 2022-03-14 ENCOUNTER — Ambulatory Visit: Payer: BC Managed Care – PPO | Admitting: Adult Health

## 2022-03-16 ENCOUNTER — Encounter: Payer: BC Managed Care – PPO | Admitting: Family Medicine

## 2022-03-19 DIAGNOSIS — G4733 Obstructive sleep apnea (adult) (pediatric): Secondary | ICD-10-CM | POA: Diagnosis not present

## 2022-03-22 ENCOUNTER — Ambulatory Visit (INDEPENDENT_AMBULATORY_CARE_PROVIDER_SITE_OTHER): Payer: BC Managed Care – PPO | Admitting: Family Medicine

## 2022-03-22 ENCOUNTER — Encounter: Payer: Self-pay | Admitting: Family Medicine

## 2022-03-22 VITALS — Ht 68.0 in | Wt 290.0 lb

## 2022-03-22 DIAGNOSIS — M722 Plantar fascial fibromatosis: Secondary | ICD-10-CM

## 2022-03-22 NOTE — Assessment & Plan Note (Signed)
Pain still occurs with prolonged standing or walking.  - orthotics today. Right insoles may need to be further adjusted.

## 2022-03-22 NOTE — Progress Notes (Signed)
  Kevin Newman - 52 y.o. male MRN [id number]  Date of birth: Dec 27, 1970  SUBJECTIVE:  Including CC & ROS.  No chief complaint on file.   Kevin Newman is a 52 y.o. male that is  here for custom orthotics.    Review of Systems See HPI   HISTORY: Past Medical, Surgical, Social, and Family History Reviewed & Updated per EMR.   Pertinent Historical Findings include:  Past Medical History:  Diagnosis Date   Anxiety    Back pain    Constipation    Diverticulosis    Essential hypertension 04/22/2016   High cholesterol    Hypertension    Hypogonadism male 08/18/2016   Internal hemorrhoids    Sleep apnea    boderline, have CPAP   Swelling     Past Surgical History:  Procedure Laterality Date   NO PAST SURGERIES       PHYSICAL EXAM:  VS: Ht '5\' 8"'$  (1.727 m)   Wt 290 lb (131.5 kg)   BMI 44.09 kg/m  Physical Exam Gen: NAD, alert, cooperative with exam, well-appearing MSK:  Neurovascularly intact    Patient was fitted for a standard, cushioned, semi-rigid orthotic. The orthotic was heated and afterward the patient stood on the orthotic blank positioned on the orthotic stand. The patient was positioned in subtalar neutral position and 10 degrees of ankle dorsiflexion in a weight bearing stance. After completion of molding, a stable base was applied to the orthotic blank. The blank was ground to a stable position for weight bearing. Size: 9 Pairs: 2 Base: Blue EVA Additional Posting and Padding: None The patient ambulated these, and they were very comfortable.     ASSESSMENT & PLAN:   Plantar fascial fibromatosis of right foot Pain still occurs with prolonged standing or walking.  - orthotics today. Right insoles may need to be further adjusted.

## 2022-03-24 ENCOUNTER — Ambulatory Visit: Payer: BC Managed Care – PPO | Admitting: Adult Health

## 2022-03-25 ENCOUNTER — Encounter: Payer: Self-pay | Admitting: Adult Health

## 2022-03-25 ENCOUNTER — Ambulatory Visit: Payer: BC Managed Care – PPO | Admitting: Adult Health

## 2022-03-25 VITALS — BP 110/75 | HR 68 | Ht 68.0 in | Wt 299.2 lb

## 2022-03-25 DIAGNOSIS — G4733 Obstructive sleep apnea (adult) (pediatric): Secondary | ICD-10-CM

## 2022-03-25 NOTE — Assessment & Plan Note (Signed)
Healthy weight loss 

## 2022-03-25 NOTE — Assessment & Plan Note (Addendum)
Excellent control and compliance on CPAP  Plan  Patient Instructions  Doristine Devoid job keep up the good work Work on healthy weight loss.  Do not drive if sleepy.  Follow up in 1 year and As needed

## 2022-03-25 NOTE — Progress Notes (Signed)
$'@Patient'm$  ID: Kevin Newman, male    DOB: October 31, 1970, 52 y.o.   MRN: [id number]  Chief Complaint  Patient presents with   Follow-up    Referring provider: [name]*  HPI: 52 year old male seen for sleep consult June 21, 2021 for daytime sleepiness snoring found to have severe sleep apnea started on CPAP  TEST/EVENTS :  Home sleep study done on August 15, 2021 shows severe sleep apnea with AHI at 30.9/hour and SPO2 low at 75%.   03/25/2022 Follow up: OSA  Patient presents for a 56-monthfollow-up.  Patient was recently found to have severe sleep apnea.  He was started on CPAP therapy.  Patient says he is doing well on CPAP.  Feels that he is benefiting from CPAP with decreased daytime sleepiness.  CPAP download shows excellent compliance at 93% usage.  Daily average usage at 7 hours.  Patient is on auto CPAP 5 to 15 cm H2O.  AHI is 1.7/hour.  Patient is currently using a nasal mask.  Allergies  Allergen Reactions   Amoxil [Amoxicillin]    Sulfa Antibiotics     Immunization History  Administered Date(s) Administered   Influenza Inj Mdck Quad Pf 12/18/2017   Influenza,inj,Quad PF,6+ Mos 10/30/2015, 04/01/2020, 11/26/2021   Influenza-Unspecified 12/05/2016, 12/18/2017   Tdap 05/24/2021   Tetanus 04/23/2011    Past Medical History:  Diagnosis Date   Anxiety    Back pain    Constipation    Diverticulosis    Essential hypertension 04/22/2016   High cholesterol    Hypertension    Hypogonadism male 08/18/2016   Internal hemorrhoids    Sleep apnea    boderline, have CPAP   Swelling     Tobacco History: Social History   Tobacco Use  Smoking Status Never  Smokeless Tobacco Never   Counseling given: Not Answered   Outpatient Medications Prior to Visit  Medication Sig Dispense Refill   azelastine (ASTELIN) 0.1 % nasal spray Place 1-2 sprays into both nostrils 2 (two) times daily as needed. 30 mL 5   loratadine (CLARITIN) 10 MG tablet      amLODipine  (NORVASC) 5 MG tablet TAKE 1 TABLET(5 MG) BY MOUTH DAILY 90 tablet 3   chlorthalidone (HYGROTON) 25 MG tablet TAKE 1 TABLET(25 MG) BY MOUTH DAILY 90 tablet 3   fenofibrate (TRICOR) 48 MG tablet Take 1 tablet (48 mg total) by mouth daily. 90 tablet 2   Magnesium 400 MG TABS Take 400 mg by mouth 2 (two) times daily.     metoprolol succinate (TOPROL-XL) 25 MG 24 hr tablet TAKE 1 TABLET(25 MG) BY MOUTH DAILY 90 tablet 3   Multiple Vitamins-Minerals (MULTIVITAMIN ADULT PO) Take by mouth.     OVER THE COUNTER MEDICATION BIOTIC DETOXIFICATION SUPPORT.  Take 1 tablet daily     potassium chloride (KLOR-CON M) 10 MEQ tablet Take 1 tablet (10 mEq total) by mouth 2 (two) times daily. 180 tablet 1   rosuvastatin (CRESTOR) 10 MG tablet TAKE 1 TABLET(10 MG) BY MOUTH DAILY 90 tablet 1   Vitamin D, Ergocalciferol, (DRISDOL) 1.25 MG (50000 UNIT) CAPS capsule Take 1 capsule (50,000 Units total) by mouth every 7 (seven) days. 4 capsule 0   vitamin E 1000 UNIT capsule Take 1,000 Units by mouth daily.     No facility-administered medications prior to visit.     Review of Systems:   Constitutional:   No  weight loss, night sweats,  Fevers, chills, fatigue, or  lassitude.  HEENT:  No headaches,  Difficulty swallowing,  Tooth/dental problems, or  Sore throat,                No sneezing, itching, ear ache, nasal congestion, post nasal drip,   CV:  No chest pain,  Orthopnea, PND, swelling in lower extremities, anasarca, dizziness, palpitations, syncope.   GI  No heartburn, indigestion, abdominal pain, nausea, vomiting, diarrhea, change in bowel habits, loss of appetite, bloody stools.   Resp: No shortness of breath with exertion or at rest.  No excess mucus, no productive cough,  No non-productive cough,  No coughing up of blood.  No change in color of mucus.  No wheezing.  No chest wall deformity  Skin: no rash or lesions.  GU: no dysuria, change in color of urine, no urgency or frequency.  No flank pain, no  hematuria   MS:  No joint pain or swelling.  No decreased range of motion.  No back pain.    Physical Exam  BP 110/75 (BP Location: Left Arm)   Pulse 68   Ht '5\' 8"'$  (1.727 m)   Wt 299 lb 3.2 oz (135.7 kg)   SpO2 95%   BMI 45.49 kg/m   GEN: A/Ox3; pleasant , NAD, well nourished    HEENT:  [place]/AT,  EACs-clear, TMs-wnl, NOSE-clear, THROAT-clear, no lesions, no postnasal drip or exudate noted.  Class 3 MP airway   NECK:  Supple w/ fair ROM; no JVD; normal carotid impulses w/o bruits; no thyromegaly or nodules palpated; no lymphadenopathy.    RESP  Clear  P & A; w/o, wheezes/ rales/ or rhonchi. no accessory muscle use, no dullness to percussion  CARD:  RRR, no m/r/g, no peripheral edema, pulses intact, no cyanosis or clubbing.  GI:   Soft & nt; nml bowel sounds; no organomegaly or masses detected.   Musco: Warm bil, no deformities or joint swelling noted.   Neuro: alert, no focal deficits noted.    Skin: Warm, no lesions or rashes    Lab Results:  CBC  No results found for: "BNP"  ProBNP No results found for: "PROBNP"  Imaging: No results found.        No data to display          No results found for: "NITRICOXIDE"      Assessment & Plan:   Obstructive sleep apnea syndrome Excellent control and compliance on CPAP  Plan  Patient Instructions  Doristine Devoid job keep up the good work Work on healthy weight loss.  Do not drive if sleepy.  Follow up in 1 year and As needed      Morbid obesity ([place]) Healthy weight loss      Rexene Edison, NP 03/25/2022

## 2022-03-25 NOTE — Patient Instructions (Signed)
Great job keep up the good work Work on [employer] loss.  Do not drive if sleepy.  Follow up in 1 year and As needed

## 2022-04-05 DIAGNOSIS — F4321 Adjustment disorder with depressed mood: Secondary | ICD-10-CM | POA: Diagnosis not present

## 2022-04-19 DIAGNOSIS — G4733 Obstructive sleep apnea (adult) (pediatric): Secondary | ICD-10-CM | POA: Diagnosis not present

## 2022-05-10 DIAGNOSIS — F4321 Adjustment disorder with depressed mood: Secondary | ICD-10-CM | POA: Diagnosis not present

## 2022-05-18 DIAGNOSIS — G4733 Obstructive sleep apnea (adult) (pediatric): Secondary | ICD-10-CM | POA: Diagnosis not present

## 2022-05-27 ENCOUNTER — Ambulatory Visit: Payer: BC Managed Care – PPO | Admitting: Family Medicine

## 2022-06-05 ENCOUNTER — Other Ambulatory Visit: Payer: Self-pay | Admitting: Family Medicine

## 2022-06-05 DIAGNOSIS — E782 Mixed hyperlipidemia: Secondary | ICD-10-CM

## 2022-06-06 ENCOUNTER — Ambulatory Visit: Payer: BC Managed Care – PPO | Admitting: Family Medicine

## 2022-06-06 ENCOUNTER — Other Ambulatory Visit: Payer: Self-pay | Admitting: Family Medicine

## 2022-06-06 VITALS — BP 110/82 | HR 85 | Temp 97.6°F | Ht 68.0 in | Wt 299.0 lb

## 2022-06-06 DIAGNOSIS — E782 Mixed hyperlipidemia: Secondary | ICD-10-CM | POA: Diagnosis not present

## 2022-06-06 DIAGNOSIS — I10 Essential (primary) hypertension: Secondary | ICD-10-CM

## 2022-06-06 DIAGNOSIS — E876 Hypokalemia: Secondary | ICD-10-CM

## 2022-06-06 LAB — LIPID PANEL
Cholesterol: 130 mg/dL (ref 0–200)
HDL: 48.6 mg/dL (ref >39.00)
LDL Cholesterol: 58 mg/dL (ref 0–99)
NonHDL: 81.81
Total CHOL/HDL Ratio: 3
Triglycerides: 117 mg/dL (ref 0.0–149.0)
VLDL: 23.4 mg/dL (ref 0.0–40.0)

## 2022-06-06 LAB — COMPREHENSIVE METABOLIC PANEL
ALT: 8 U/L (ref 0–53)
AST: 17 U/L (ref 0–37)
Albumin: 4.3 g/dL (ref 3.5–5.2)
Alkaline Phosphatase: 45 U/L (ref 39–117)
BUN: 27 mg/dL — ABNORMAL HIGH (ref 6–23)
CO2: 29 mEq/L (ref 19–32)
Calcium: 9.2 mg/dL (ref 8.4–10.5)
Chloride: 103 mEq/L (ref 96–112)
Creatinine, Ser: 1.16 mg/dL (ref 0.40–1.50)
GFR: 73.73 mL/min (ref >60.00)
Glucose, Bld: 91 mg/dL (ref 70–99)
Potassium: 3.4 mEq/L — ABNORMAL LOW (ref 3.5–5.1)
Sodium: 141 mEq/L (ref 135–145)
Total Bilirubin: 1.4 mg/dL — ABNORMAL HIGH (ref 0.2–1.2)
Total Protein: 6.3 g/dL (ref 6.0–8.3)

## 2022-06-06 NOTE — Progress Notes (Signed)
Chief Complaint  Patient presents with   Follow-up    6 month     Subjective Kevin Newman is a 52 y.o. male who presents for hypertension follow up. He does not monitor home blood pressures. He is compliant with medications- chlorthalidone 25 mg/d, Toprol XL 25 mg/d, Norvasc 5 mg/d. (Had been out for 2 days) Patient has these side effects of medication: none He is sometimes  adhering to a healthy diet overall. Current exercise: limited No CP or SOB.   Hyperlipidemia Patient presents for dyslipidemia follow up. Currently being treated with Tricor 48 mg/d, Crestor 10 mg/d and compliance with treatment thus far has been good. He denies myalgias. Diet/exercise as above.  The patient is not known to have coexisting coronary artery disease.   Past Medical History:  Diagnosis Date   Anxiety    Back pain    Constipation    Diverticulosis    Essential hypertension 04/22/2016   High cholesterol    Hypertension    Hypogonadism male 08/18/2016   Internal hemorrhoids    Sleep apnea    boderline, have CPAP   Swelling     Exam BP 110/82 (BP Location: Left Arm, Patient Position: Sitting, Cuff Size: Large)   Pulse 85   Temp 97.6 F (36.4 C) (Oral)   Ht 5\' 8"  (1.727 m)   Wt 299 lb (135.6 kg)   SpO2 98%   BMI 45.46 kg/m  General:  well developed, well nourished, in no apparent distress Heart: RRR, no bruits, no LE edema Lungs: clear to auscultation, no accessory muscle use Psych: well oriented with normal range of affect and appropriate judgment/insight  Essential hypertension  Mixed hyperlipidemia - Plan: Comprehensive metabolic panel, Lipid panel  Chronic, stable. Cont Toprol XL 25 mg/d, Norvasc 5 mg/d. Stop chlorthalidone and K as his BP looks good and he has not been on medication. Monitor BP at home and if <140/90, can cont off of above.  Counseled on diet and exercise. Chronic, stable. Cont Crestor 10 mg/d, Tricor 48 mg/d.  F/u in 6 mo. The patient voiced  understanding and agreement to the plan.  Jilda Roche [place], DO 06/06/22  8:11 AM

## 2022-06-06 NOTE — Patient Instructions (Addendum)
Give [place] 2-3 business days to get the results of your labs back.   Keep the diet clean and stay active.  Keep an eye on your blood pressure over the next month. If <140/90, we can stay on the regimen.   Let us know if you need anything.

## 2022-06-13 ENCOUNTER — Encounter: Payer: Self-pay | Admitting: *Deleted

## 2022-06-15 ENCOUNTER — Encounter: Payer: Self-pay | Admitting: Family Medicine

## 2022-06-15 ENCOUNTER — Other Ambulatory Visit: Payer: Self-pay | Admitting: Family Medicine

## 2022-06-15 ENCOUNTER — Other Ambulatory Visit (INDEPENDENT_AMBULATORY_CARE_PROVIDER_SITE_OTHER): Payer: BC Managed Care – PPO

## 2022-06-15 DIAGNOSIS — E876 Hypokalemia: Secondary | ICD-10-CM

## 2022-06-15 DIAGNOSIS — I10 Essential (primary) hypertension: Secondary | ICD-10-CM

## 2022-06-15 LAB — BASIC METABOLIC PANEL
BUN: 22 mg/dL (ref 6–23)
CO2: 29 mEq/L (ref 19–32)
Calcium: 9.3 mg/dL (ref 8.4–10.5)
Chloride: 103 mEq/L (ref 96–112)
Creatinine, Ser: 1.2 mg/dL (ref 0.40–1.50)
GFR: 70.78 mL/min (ref >60.00)
Glucose, Bld: 88 mg/dL (ref 70–99)
Potassium: 3.9 mEq/L (ref 3.5–5.1)
Sodium: 140 mEq/L (ref 135–145)

## 2022-06-15 MED ORDER — CHLORTHALIDONE 25 MG PO TABS: 25.0000 mg | ORAL_TABLET | Freq: Every day | ORAL | 1 refills | Status: DC

## 2022-06-15 MED ORDER — POTASSIUM CHLORIDE CRYS ER 10 MEQ PO TBCR: EXTENDED_RELEASE_TABLET | ORAL | 1 refills | Status: DC

## 2022-06-18 DIAGNOSIS — G4733 Obstructive sleep apnea (adult) (pediatric): Secondary | ICD-10-CM | POA: Diagnosis not present

## 2022-09-03 ENCOUNTER — Other Ambulatory Visit: Payer: Self-pay | Admitting: Family Medicine

## 2022-09-30 DIAGNOSIS — F4321 Adjustment disorder with depressed mood: Secondary | ICD-10-CM | POA: Diagnosis not present

## 2022-10-04 DIAGNOSIS — G4733 Obstructive sleep apnea (adult) (pediatric): Secondary | ICD-10-CM | POA: Diagnosis not present

## 2022-10-17 DIAGNOSIS — F4321 Adjustment disorder with depressed mood: Secondary | ICD-10-CM | POA: Diagnosis not present

## 2022-10-18 DIAGNOSIS — B070 Plantar wart: Secondary | ICD-10-CM | POA: Diagnosis not present

## 2022-10-18 DIAGNOSIS — L219 Seborrheic dermatitis, unspecified: Secondary | ICD-10-CM | POA: Diagnosis not present

## 2022-10-18 DIAGNOSIS — L821 Other seborrheic keratosis: Secondary | ICD-10-CM | POA: Diagnosis not present

## 2022-10-18 DIAGNOSIS — L578 Other skin changes due to chronic exposure to nonionizing radiation: Secondary | ICD-10-CM | POA: Diagnosis not present

## 2022-10-21 DIAGNOSIS — F4321 Adjustment disorder with depressed mood: Secondary | ICD-10-CM | POA: Diagnosis not present

## 2022-11-07 DIAGNOSIS — F4321 Adjustment disorder with depressed mood: Secondary | ICD-10-CM | POA: Diagnosis not present

## 2022-11-23 DIAGNOSIS — F4321 Adjustment disorder with depressed mood: Secondary | ICD-10-CM | POA: Diagnosis not present

## 2022-12-02 ENCOUNTER — Other Ambulatory Visit: Payer: Self-pay | Admitting: Family Medicine

## 2022-12-02 DIAGNOSIS — I10 Essential (primary) hypertension: Secondary | ICD-10-CM

## 2022-12-28 DIAGNOSIS — F4321 Adjustment disorder with depressed mood: Secondary | ICD-10-CM | POA: Diagnosis not present

## 2023-01-18 DIAGNOSIS — G4733 Obstructive sleep apnea (adult) (pediatric): Secondary | ICD-10-CM | POA: Diagnosis not present

## 2023-03-08 ENCOUNTER — Encounter: Payer: Self-pay | Admitting: Family Medicine

## 2023-03-08 DIAGNOSIS — E782 Mixed hyperlipidemia: Secondary | ICD-10-CM

## 2023-03-09 MED ORDER — ROSUVASTATIN CALCIUM 10 MG PO TABS: 10.0000 mg | ORAL_TABLET | Freq: Every day | ORAL | 0 refills | Status: DC

## 2023-03-09 MED ORDER — METOPROLOL SUCCINATE ER 25 MG PO TB24: 25.0000 mg | ORAL_TABLET | Freq: Every day | ORAL | 0 refills | Status: DC

## 2023-03-09 MED ORDER — FENOFIBRATE 48 MG PO TABS
48.0000 mg | ORAL_TABLET | Freq: Every day | ORAL | 0 refills | Status: DC
Start: 2023-03-09 — End: 2023-03-27

## 2023-03-13 DIAGNOSIS — F4321 Adjustment disorder with depressed mood: Secondary | ICD-10-CM | POA: Diagnosis not present

## 2023-03-27 ENCOUNTER — Ambulatory Visit: Payer: BC Managed Care – PPO | Admitting: Family Medicine

## 2023-03-27 ENCOUNTER — Encounter: Payer: Self-pay | Admitting: Family Medicine

## 2023-03-27 VITALS — BP 128/82 | HR 86 | Temp 98.0°F | Resp 16 | Ht 68.0 in | Wt 295.2 lb

## 2023-03-27 DIAGNOSIS — Z23 Encounter for immunization: Secondary | ICD-10-CM

## 2023-03-27 DIAGNOSIS — E782 Mixed hyperlipidemia: Secondary | ICD-10-CM

## 2023-03-27 DIAGNOSIS — I10 Essential (primary) hypertension: Secondary | ICD-10-CM | POA: Diagnosis not present

## 2023-03-27 LAB — COMPREHENSIVE METABOLIC PANEL
ALT: 7 U/L (ref 0–53)
AST: 19 U/L (ref 0–37)
Albumin: 4.7 g/dL (ref 3.5–5.2)
Alkaline Phosphatase: 53 U/L (ref 39–117)
BUN: 21 mg/dL (ref 6–23)
CO2: 30 meq/L (ref 19–32)
Calcium: 9.7 mg/dL (ref 8.4–10.5)
Chloride: 100 meq/L (ref 96–112)
Creatinine, Ser: 1.08 mg/dL (ref 0.40–1.50)
GFR: 79.88 mL/min (ref >60.00)
Glucose, Bld: 84 mg/dL (ref 70–99)
Potassium: 3.5 meq/L (ref 3.5–5.1)
Sodium: 140 meq/L (ref 135–145)
Total Bilirubin: 1.8 mg/dL — ABNORMAL HIGH (ref 0.2–1.2)
Total Protein: 6.8 g/dL (ref 6.0–8.3)

## 2023-03-27 LAB — LIPID PANEL
Cholesterol: 110 mg/dL (ref 0–200)
HDL: 47.5 mg/dL (ref >39.00)
LDL Cholesterol: 39 mg/dL (ref 0–99)
NonHDL: 62.91
Total CHOL/HDL Ratio: 2
Triglycerides: 120 mg/dL (ref 0.0–149.0)
VLDL: 24 mg/dL (ref 0.0–40.0)

## 2023-03-27 MED ORDER — FENOFIBRATE 48 MG PO TABS: 48.0000 mg | ORAL_TABLET | Freq: Every day | ORAL | 2 refills | Status: DC

## 2023-03-27 MED ORDER — ROSUVASTATIN CALCIUM 10 MG PO TABS: 10.0000 mg | ORAL_TABLET | Freq: Every day | ORAL | 2 refills | Status: DC

## 2023-03-27 MED ORDER — METOPROLOL SUCCINATE ER 25 MG PO TB24: 25.0000 mg | ORAL_TABLET | Freq: Every day | ORAL | 2 refills | Status: DC

## 2023-03-27 NOTE — Patient Instructions (Addendum)
Give [place] 2-3 business days to get the results of your labs back.   Keep the diet clean and stay active.  Please consider adding some weight resistance exercise to your routine. Consider yoga as well.   Let us know if you need anything.

## 2023-03-27 NOTE — Progress Notes (Signed)
Chief Complaint  Patient presents with   Follow-up    Follow up     Subjective Dail Lerew is a 53 y.o. male who presents for hypertension follow up. He does not monitor home blood pressures. He is compliant with medications-Norvasc 5 mg daily, Toprol-XL 25 mg daily, chlorthalidone 25 mg daily. Patient has these side effects of medication: none He is sometimes adhering to a healthy diet overall. Current exercise: walking, cycling No CP or SOB.   Hyperlipidemia Patient presents for dyslipidemia follow up. Currently being treated with Crestor 20 mg daily, Tricor 40 mg daily and compliance with treatment thus far has been good. He denies myalgias. Diet/exercise as above. The patient is not known to have coexisting coronary artery disease.   Past Medical History:  Diagnosis Date   Anxiety    Back pain    Constipation    Diverticulosis    Essential hypertension 04/22/2016   High cholesterol    Hypertension    Hypogonadism male 08/18/2016   Internal hemorrhoids    Sleep apnea    boderline, have CPAP   Swelling     Exam BP 128/82   Pulse 86   Temp 98 F (36.7 C) (Oral)   Resp 16   Ht 5\' 8"  (1.727 m)   Wt 295 lb 3.2 oz (133.9 kg)   SpO2 98%   BMI 44.89 kg/m  General:  well developed, well nourished, in no apparent distress Heart: RRR, no bruits, no LE edema Lungs: clear to auscultation, no accessory muscle use Psych: well oriented with normal range of affect and appropriate judgment/insight  Essential hypertension - Plan: metoprolol succinate (TOPROL-XL) 25 MG 24 hr tablet  Mixed hyperlipidemia - Plan: Comprehensive metabolic panel, Lipid panel, fenofibrate (TRICOR) 48 MG tablet, rosuvastatin (CRESTOR) 10 MG tablet  Chronic, stable.  Continue chlorthalidone 25 mg daily, Toprol-XL 25 mg daily, Norvasc 5 mg daily.  Counseled on diet and exercise. Chronic, probably stable.  Continue Crestor 20 mg daily, Tricor 48 mg daily. F/u in 6 months. The patient voiced  understanding and agreement to the plan.  Jilda Roche [place], DO 03/27/23  8:17 AM

## 2023-03-27 NOTE — Addendum Note (Signed)
Addended by: Kathi Ludwig on: 03/27/2023 08:36 AM   Modules accepted: Orders

## 2023-03-31 ENCOUNTER — Encounter: Payer: Self-pay | Admitting: Adult Health

## 2023-03-31 ENCOUNTER — Telehealth (INDEPENDENT_AMBULATORY_CARE_PROVIDER_SITE_OTHER): Payer: BC Managed Care – PPO | Admitting: Adult Health

## 2023-03-31 DIAGNOSIS — G4733 Obstructive sleep apnea (adult) (pediatric): Secondary | ICD-10-CM | POA: Diagnosis not present

## 2023-03-31 NOTE — Progress Notes (Signed)
Virtual Visit via Video Note  I connected with Gwendolyn Lima on 03/31/23 at  4:00 PM EST by a video enabled telemedicine application and verified that I am speaking with the correct person using two identifiers.  Location: Patient: Home  Provider: Office    I discussed the limitations of evaluation and management by telemedicine and the availability of in person appointments. The patient expressed understanding and agreed to proceed.  History of Present Illness: 53 year old male seen for sleep consult June 21, 2021 for daytime sleepiness found to have severe sleep apnea.  Today's video visit is a 1 year follow-up for sleep apnea.  Patient has severe obstructive sleep apnea is on nocturnal CPAP.  Patient says he is doing well on CPAP.  He feels rested.  Feels that he benefits from CPAP with decreased daytime sleepiness.  CPAP download shows good compliance with 70% usage.  Daily average usage at 6.5 hours.  Patient is on auto CPAP 5 to 15 cm H2O.  AHI 1.7/hour.  Daily average pressure at 10.2 cm H2O. Occasionally falls asleep before he puts on the CPAP .  Uses a full face . DME [facility] .   He is working on weight loss.  Has been more active.  We did discuss weight loss options.  Past Medical History:  Diagnosis Date   Anxiety    Back pain    Constipation    Diverticulosis    Essential hypertension 04/22/2016   High cholesterol    Hypertension    Hypogonadism male 08/18/2016   Internal hemorrhoids    Sleep apnea    boderline, have CPAP   Swelling     Current Outpatient Medications on File Prior to Visit  Medication Sig Dispense Refill   amLODipine (NORVASC) 5 MG tablet TAKE 1 TABLET(5 MG) BY MOUTH DAILY 90 tablet 3   azelastine (ASTELIN) 0.1 % nasal spray Place 1-2 sprays into both nostrils 2 (two) times daily as needed. 30 mL 5   chlorthalidone (HYGROTON) 25 MG tablet TAKE 1 TABLET(25 MG) BY MOUTH DAILY 90 tablet 1   fenofibrate (TRICOR) 48 MG tablet Take 1 tablet (48 mg  total) by mouth daily. 90 tablet 2   loratadine (CLARITIN) 10 MG tablet      Magnesium 400 MG TABS Take 400 mg by mouth 2 (two) times daily.     metoprolol succinate (TOPROL-XL) 25 MG 24 hr tablet Take 1 tablet (25 mg total) by mouth daily. 90 tablet 2   Multiple Vitamins-Minerals (MULTIVITAMIN ADULT PO) Take by mouth.     OVER THE COUNTER MEDICATION BIOTIC DETOXIFICATION SUPPORT.  Take 1 tablet daily     potassium chloride (KLOR-CON M) 10 MEQ tablet TAKE 2 TABLETS(20 MEQ) BY MOUTH DAILY 180 tablet 1   rosuvastatin (CRESTOR) 10 MG tablet Take 1 tablet (10 mg total) by mouth daily. 90 tablet 2   No current facility-administered medications on file prior to visit.       Observations/Objective: Home sleep study done on August 15, 2021 shows severe sleep apnea with AHI at 30.9/hour and SPO2 low at 75%.   Assessment and Plan: Severe obstructive sleep apnea with good control on CPAP Continue on current settings.  Encouraged to use daily try to get in at least 6 to 7 hours each night CPAP care discussed  Obesity with BMI of 44.  We discussed healthy weight loss.  Plan  Patient Instructions  [employer] job keep up the good work Work on healthy weight loss.  Do not drive  if sleepy.  Follow up in 1 year and As needed      Follow Up Instructions:    I discussed the assessment and treatment plan with the patient. The patient was provided an opportunity to ask questions and all were answered. The patient agreed with the plan and demonstrated an understanding of the instructions.   The patient was advised to call back or seek an in-person evaluation if the symptoms worsen or if the condition fails to improve as anticipated.  I provided 20  minutes of non-face-to-face time during this encounter.   Rubye [place]aks, NP

## 2023-03-31 NOTE — Patient Instructions (Signed)
Great job keep up the good work Work on [employer] loss.  Do not drive if sleepy.  Follow up in 1 year and As needed

## 2023-05-08 DIAGNOSIS — F4321 Adjustment disorder with depressed mood: Secondary | ICD-10-CM | POA: Diagnosis not present

## 2023-07-12 DIAGNOSIS — G4733 Obstructive sleep apnea (adult) (pediatric): Secondary | ICD-10-CM | POA: Diagnosis not present

## 2023-08-01 ENCOUNTER — Other Ambulatory Visit: Payer: Self-pay

## 2023-08-01 ENCOUNTER — Encounter: Payer: Self-pay | Admitting: Family Medicine

## 2023-08-01 DIAGNOSIS — I10 Essential (primary) hypertension: Secondary | ICD-10-CM

## 2023-08-01 MED ORDER — POTASSIUM CHLORIDE CRYS ER 10 MEQ PO TBCR: EXTENDED_RELEASE_TABLET | ORAL | 1 refills | Status: DC

## 2023-08-01 MED ORDER — CHLORTHALIDONE 25 MG PO TABS: 25.0000 mg | ORAL_TABLET | Freq: Every day | ORAL | 1 refills | Status: DC

## 2023-08-18 ENCOUNTER — Ambulatory Visit: Admitting: Family Medicine

## 2023-08-18 VITALS — BP 130/84 | HR 87 | Temp 98.0°F | Resp 16 | Ht 68.0 in | Wt 295.0 lb

## 2023-08-18 DIAGNOSIS — W57XXXA Bitten or stung by nonvenomous insect and other nonvenomous arthropods, initial encounter: Secondary | ICD-10-CM

## 2023-08-18 DIAGNOSIS — S30861A Insect bite (nonvenomous) of abdominal wall, initial encounter: Secondary | ICD-10-CM

## 2023-08-18 NOTE — Progress Notes (Signed)
 Chief Complaint  Patient presents with   Tick Removal    Tick Bite/ Rash    Kevin Newman is a 53 y.o. male here for a skin complaint.  Duration: 8 days Location: side Pruritic? Not anymore Painful? No Drainage? No New soaps/lotions/topicals/detergents? No Trauma? Yes- bitten by a tick Other associated symptoms: no fevers; still having quite a bit of redness Therapies tried thus far: hydrocortisone cream  Past Medical History:  Diagnosis Date   Anxiety    Back pain    Constipation    Diverticulosis    Essential hypertension 04/22/2016   High cholesterol    Hypertension    Hypogonadism male 08/18/2016   Internal hemorrhoids    Sleep apnea    boderline, have CPAP   Swelling     BP 130/84 (BP Location: Left Arm, Patient Position: Sitting)   Pulse 87   Temp 98 F (36.7 C) (Oral)   Resp 16   Ht 5' 8 (1.727 m)   Wt 295 lb (133.8 kg)   SpO2 98%   BMI 44.85 kg/m  Gen: awake, alert, appearing stated age Lungs: No accessory muscle use Skin: See below. No drainage, erythema, TTP, fluctuance Psych: Age appropriate judgment and insight   L lateral abd  Tick bite of abdominal wall, initial encounter  No sign of Lyme. TAO over excoriated region. Warning signs and symptoms verbalized and written down in AVS.  F/u prn. The patient voiced understanding and agreement to the plan.  Shellie Dials [place], DO 08/18/23 11:27 AM

## 2023-08-18 NOTE — Patient Instructions (Addendum)
 Ice/cold pack over area for 10-15 min twice daily.  Do not shower for the rest of the day. When you do wash it, use only soap and water. Do not vigorously scrub. Apply triple antibiotic ointment (like Neosporin) twice daily. Keep the area clean and dry.   Things to look out for: increasing pain not relieved by ibuprofen/acetaminophen, fevers, spreading redness, drainage of pus, development of a target lesion or foul odor.  Let us  know if you need anything.

## 2023-09-26 ENCOUNTER — Ambulatory Visit: Payer: Self-pay | Admitting: Family Medicine

## 2023-09-26 ENCOUNTER — Encounter: Payer: Self-pay | Admitting: Family Medicine

## 2023-09-26 ENCOUNTER — Ambulatory Visit (INDEPENDENT_AMBULATORY_CARE_PROVIDER_SITE_OTHER): Payer: BC Managed Care – PPO | Admitting: Family Medicine

## 2023-09-26 VITALS — BP 126/78 | HR 77 | Temp 97.8°F | Resp 16 | Ht 68.0 in | Wt 298.4 lb

## 2023-09-26 DIAGNOSIS — Z125 Encounter for screening for malignant neoplasm of prostate: Secondary | ICD-10-CM | POA: Diagnosis not present

## 2023-09-26 DIAGNOSIS — Z0000 Encounter for general adult medical examination without abnormal findings: Secondary | ICD-10-CM | POA: Diagnosis not present

## 2023-09-26 LAB — COMPREHENSIVE METABOLIC PANEL WITH GFR
ALT: 8 U/L (ref 0–53)
AST: 16 U/L (ref 0–37)
Albumin: 4.5 g/dL (ref 3.5–5.2)
Alkaline Phosphatase: 48 U/L (ref 39–117)
BUN: 23 mg/dL (ref 6–23)
CO2: 30 meq/L (ref 19–32)
Calcium: 9.3 mg/dL (ref 8.4–10.5)
Chloride: 105 meq/L (ref 96–112)
Creatinine, Ser: 1.21 mg/dL (ref 0.40–1.50)
GFR: 69.45 mL/min (ref >60.00)
Glucose, Bld: 91 mg/dL (ref 70–99)
Potassium: 3.7 meq/L (ref 3.5–5.1)
Sodium: 144 meq/L (ref 135–145)
Total Bilirubin: 1.5 mg/dL — ABNORMAL HIGH (ref 0.2–1.2)
Total Protein: 6.5 g/dL (ref 6.0–8.3)

## 2023-09-26 LAB — CBC
HCT: 46.2 % (ref 39.0–52.0)
Hemoglobin: 15.4 g/dL (ref 13.0–17.0)
MCHC: 33.4 g/dL (ref 30.0–36.0)
MCV: 92.3 fl (ref 78.0–100.0)
Platelets: 205 K/uL (ref 150.0–400.0)
RBC: 5.01 Mil/uL (ref 4.22–5.81)
RDW: 13.6 % (ref 11.5–15.5)
WBC: 6.6 K/uL (ref 4.0–10.5)

## 2023-09-26 LAB — HEPATITIS B SURFACE ANTIBODY, QUANTITATIVE: Hep B S AB Quant (Post): <5 m[IU]/mL — ABNORMAL LOW (ref >=10)

## 2023-09-26 LAB — PSA: PSA: 0.74 ng/mL (ref 0.10–4.00)

## 2023-09-26 LAB — LIPID PANEL
Cholesterol: 114 mg/dL (ref 0–200)
HDL: 58.8 mg/dL (ref >39.00)
LDL Cholesterol: 40 mg/dL (ref 0–99)
NonHDL: 55.15
Total CHOL/HDL Ratio: 2
Triglycerides: 77 mg/dL (ref 0.0–149.0)
VLDL: 15.4 mg/dL (ref 0.0–40.0)

## 2023-09-26 NOTE — Progress Notes (Signed)
 Chief Complaint  Patient presents with   Annual Exam    CPE    Well Male Kevin Newman is here for a complete physical.   His last physical was >1 year ago.  Current diet: in general, diet is OK.  Current exercise: cycling, walking, strength training Weight trend: stable Fatigue out of ordinary? No. Seat belt? Yes.   Advanced directive? Yes  Health maintenance Shingrix- No Colonoscopy- Yes Tetanus- Yes HIV- Yes Hep C- Yes   Past Medical History:  Diagnosis Date   Anxiety    Back pain    Constipation    Diverticulosis    Essential hypertension 04/22/2016   High cholesterol    Hypertension    Hypogonadism male 08/18/2016   Internal hemorrhoids    Sleep apnea    boderline, have CPAP   Swelling       Past Surgical History:  Procedure Laterality Date   NO PAST SURGERIES      Medications  Current Outpatient Medications on File Prior to Visit  Medication Sig Dispense Refill   amLODipine  (NORVASC ) 5 MG tablet TAKE 1 TABLET(5 MG) BY MOUTH DAILY 90 tablet 3   azelastine  (ASTELIN ) 0.1 % nasal spray Place 1-2 sprays into both nostrils 2 (two) times daily as needed. 30 mL 5   chlorthalidone  (HYGROTON ) 25 MG tablet Take 1 tablet (25 mg total) by mouth daily. TAKE 1 TABLET(25 MG) BY MOUTH DAILY 90 tablet 1   fenofibrate  (TRICOR ) 48 MG tablet Take 1 tablet (48 mg total) by mouth daily. 90 tablet 2   loratadine (CLARITIN) 10 MG tablet      Magnesium 400 MG TABS Take 400 mg by mouth 2 (two) times daily.     metoprolol  succinate (TOPROL -XL) 25 MG 24 hr tablet Take 1 tablet (25 mg total) by mouth daily. 90 tablet 2   Multiple Vitamins-Minerals (MULTIVITAMIN ADULT PO) Take by mouth.     OVER THE COUNTER MEDICATION BIOTIC DETOXIFICATION SUPPORT.  Take 1 tablet daily     potassium chloride  (KLOR-CON  M) 10 MEQ tablet TAKE 2 TABLETS(20 MEQ) BY MOUTH DAILY 180 tablet 1   rosuvastatin  (CRESTOR ) 10 MG tablet Take 1 tablet (10 mg total) by mouth daily. 90 tablet 2     Allergies Allergies  Allergen Reactions   Amoxil [Amoxicillin]    Sulfa Antibiotics     Family History Family History  Problem Relation Age of Onset   Hypertension Mother    Thyroid  disease Mother    Gallbladder disease Mother    Gallbladder disease Father    Heart Problems Father    Heart disease Maternal Grandmother 68   Heart attack Maternal Grandfather    Stroke Maternal Grandfather    Breast cancer Paternal Grandmother    Prostate cancer Paternal Grandfather    Cancer Maternal Uncle        type unknown    Review of Systems: Constitutional:  no fevers Eye:  no recent significant change in vision Ear/Nose/Mouth/Throat:  Ears:  no hearing loss Nose/Mouth/Throat:  no complaints of nasal congestion, no sore throat Cardiovascular:  no chest pain Respiratory:  no shortness of breath Gastrointestinal:  no change in bowel habits GU:  Male: negative for dysuria, frequency Musculoskeletal/Extremities:  no joint pain Integumentary (Skin/Breast):  no abnormal skin lesions reported Neurologic:  no headaches Endocrine: No unexpected weight changes Hematologic/Lymphatic:  no abnormal bleeding  Exam BP 126/78 (BP Location: Left Arm, Patient Position: Sitting)   Pulse 77   Temp 97.8 F (36.6 C) (Oral)  Resp 16   Ht 5' 8 (1.727 m)   Wt 298 lb 6.4 oz (135.4 kg)   SpO2 96%   BMI 45.37 kg/m  General:  well developed, well nourished, in no apparent distress Skin:  no significant moles, warts, or growths Head:  no masses, lesions, or tenderness Eyes:  pupils equal and round, sclera anicteric without injection Ears:  canals without lesions, TMs shiny without retraction, no obvious effusion, no erythema Nose:  nares patent, mucosa normal Throat/Pharynx:  lips and gingiva without lesion; tongue and uvula midline; non-inflamed pharynx; no exudates or postnasal drainage Neck: neck supple without adenopathy, thyromegaly, or masses Cardiac: RRR, no bruits, no LE edema Lungs:   clear to auscultation, breath sounds equal bilaterally, no respiratory distress Abdomen: BS+, soft, non-tender, non-distended, no masses or organomegaly noted Rectal: Deferred Musculoskeletal:  symmetrical muscle groups noted without atrophy or deformity Neuro:  gait normal; deep tendon reflexes normal and symmetric Psych: well oriented with normal range of affect and appropriate judgment/insight  Assessment and Plan  Well adult exam - Plan: CBC, Comprehensive metabolic panel with GFR, Hepatitis B surface antibody,quantitative, Lipid panel  Screening for prostate cancer - Plan: PSA   Well 53 y.o. male. Counseled on diet and exercise.  Recommending increasing duration of cycling steadily.  Does not necessarily need to do this every day though. Counseled on risks and benefits of prostate cancer screening with PSA. The patient agrees to undergo testing. Immunizations, labs, and further orders as above. Advanced directive form requested today.  Shingrix recommended. Follow up in 6 months. The patient voiced understanding and agreement to the plan.  Mabel [place], DO 09/26/23 9:02 AM

## 2023-09-26 NOTE — Patient Instructions (Addendum)

## 2023-10-19 ENCOUNTER — Other Ambulatory Visit: Payer: Self-pay | Admitting: Family Medicine

## 2023-10-19 DIAGNOSIS — I10 Essential (primary) hypertension: Secondary | ICD-10-CM

## 2023-11-02 DIAGNOSIS — L219 Seborrheic dermatitis, unspecified: Secondary | ICD-10-CM | POA: Diagnosis not present

## 2023-11-02 DIAGNOSIS — L578 Other skin changes due to chronic exposure to nonionizing radiation: Secondary | ICD-10-CM | POA: Diagnosis not present

## 2023-11-02 DIAGNOSIS — L821 Other seborrheic keratosis: Secondary | ICD-10-CM | POA: Diagnosis not present

## 2023-11-03 DIAGNOSIS — G4733 Obstructive sleep apnea (adult) (pediatric): Secondary | ICD-10-CM | POA: Diagnosis not present

## 2023-12-18 ENCOUNTER — Ambulatory Visit: Admitting: Family Medicine

## 2023-12-18 VITALS — BP 122/80 | HR 73 | Temp 97.8°F | Resp 16 | Ht 68.0 in | Wt 300.0 lb

## 2023-12-18 DIAGNOSIS — R0789 Other chest pain: Secondary | ICD-10-CM | POA: Diagnosis not present

## 2023-12-18 NOTE — Progress Notes (Signed)
 Chief Complaint  Patient presents with   Flank Pain    Flank Pain    Kevin Newman is here for abdominal pain.  Duration: 3 days RUQ/lower chest range.  Sharp pain.  Nighttime awakenings? No Bleeding? No Weight loss? No Palliation: sitting up Provocation: possibly fatty foods; no other obvious triggers Maybe lasts for 10 min Associated symptoms: None Denies: fever, nausea, and vomiting, bowel changes Treatment to date: ibuprofen did not make it better or worse  Past Medical History:  Diagnosis Date   Anxiety    Back pain    Constipation    Diverticulosis    Essential hypertension 04/22/2016   High cholesterol    Hypertension    Hypogonadism male 08/18/2016   Internal hemorrhoids    Sleep apnea    boderline, have CPAP   Swelling     BP 122/80 (BP Location: Left Arm, Patient Position: Sitting)   Pulse 73   Temp 97.8 F (36.6 C) (Oral)   Resp 16   Ht 5' 8 (1.727 m)   Wt 300 lb (136.1 kg)   SpO2 97%   BMI 45.61 kg/m  Gen.: Awake, alert, appears stated age HEENT: Mucous membranes dry without mucosal lesions Heart: Regular rate and rhythm  Lungs: Clear auscultation bilaterally, no rales or wheezing, normal effort without accessory muscle use. Abdomen: Bowel sounds are present. Abdomen is soft, nontender, nondistended, no masses or organomegaly. Negative Murphy's, Rovsing's, McBurney's, and Carnett's sign. MSK: +TTP over R lower chest wall just lateral to mid clavicular line around lower sternal level Psych: Age appropriate judgment and insight. Normal mood and affect.  Acute chest wall pain  Does not have s/s's of gallbladder issues based on exam. Questionable hx. If consistent over next few days with fatty meals, he will let me know and we will proceed w RUQ US . Tylenol, heat, ice, stretches for chest wall until then.  F/u prn Pt voiced understanding and agreement to the plan.  Mabel [place], DO 12/18/23 7:18 AM

## 2023-12-18 NOTE — Patient Instructions (Signed)
Heat (pad or rice pillow in microwave) over affected area, 10-15 minutes twice daily.  ° °Ice/cold pack over area for 10-15 min twice daily. ° °OK to take Tylenol 1000 mg (2 extra strength tabs) or 975 mg (3 regular strength tabs) every 6 hours as needed. ° °Let us know if you need anything. ° °Pectoralis Major Rehab °Ask your health care provider which exercises are safe for you. Do exercises exactly as told by your health care provider and adjust them as directed. It is normal to feel mild stretching, pulling, tightness, or discomfort as you do these exercises, but you should stop right away if you feel sudden pain or your pain gets worse. Do not begin these exercises until told by your health care provider. °Stretching and range of motion exercises °These exercises warm up your muscles and joints and improve the movement and flexibility of your shoulder. These exercises can also help to relieve pain, numbness, and tingling. °Exercise A: Pendulum ° °Stand near a wall or a surface that you can hold onto for balance. °Bend at the waist and let your left / right arm hang straight down. Use your other arm to keep your balance. °Relax your arm and shoulder muscles, and move your hips and your trunk so your left / right arm swings freely. Your arm should swing because of the motion of your body, not because you are using your arm or shoulder muscles. °Keep moving so your arm swings in the following directions, as told by your health care provider: °Side to side. °Forward and backward. °In clockwise and counterclockwise circles. °Slowly return to the starting position. °Repeat 2 times. Complete this exercise 3 times per week. °Exercise B: Abduction, standing °Stand and hold a broomstick, a cane, or a similar object. Place your hands a little more than shoulder-width apart on the object. Your left / right hand should be palm-up, and your other hand should be palm-down. °While keeping your elbow straight and your shoulder  muscles relaxed, push the stick across your body toward your left / right side. Raise your left / right arm to the side of your body and then over your head until you feel a stretch in your shoulder. °Stop when you reach the angle that is recommended by your health care provider. °Avoid shrugging your shoulder while you raise your arm. Keep your shoulder blade tucked down toward the middle of your spine. °Hold for 10 seconds. °Slowly return to the starting position. °Repeat 2 times. Complete this exercise 3 times per week. °Exercise C: Wand flexion, supine ° °Lie on your back. You may bend your knees for comfort. °Hold a broomstick, a cane, or a similar object so that your hands are about shoulder-width apart on the object. Your palms should face toward your feet. °Raise your left / right arm in front of your face, then behind your head (toward the floor). Use your other hand to help you do this. Stop when you feel a gentle stretch in your shoulder, or when you reach the angle that is recommended by your health care provider. °Hold for 3 seconds. °Use the broomstick and your other arm to help you return your left / right arm to the starting position. °Repeat 2 times. Complete this exercise 3 times per week. °Exercise D: Wand shoulder external rotation °Stand and hold a broomstick, a cane, or a similar object so your hands are about shoulder-width apart on the object. °Start with your arms hanging down, then bend both   elbows to an "L" shape (90 degrees). °Keep your left / right elbow at your side. Use your other hand to push the stick so your left / right forearm moves away from your body, out to your side. °Keep your left / right elbow bent to 90 degrees and keep it against your side. °Stop when you feel a gentle stretch in your shoulder, or when you reach the angle recommended by your health care provider. °Hold for 10 seconds. °Use the stick to help you return your left / right arm to the starting  position. °Repeat 2 times. Complete this exercise 3 times per week. °Strengthening exercises °These exercises build strength and endurance in your shoulder. Endurance is the ability to use your muscles for a long time, even after your muscles get tired. °Exercise E: Scapular protraction, standing °Stand so you are facing a wall. Place your feet about one arm-length away from the wall. °Place your hands on the wall and straighten your elbows. °Keep your hands on the wall as you push your upper back away from the wall. You should feel your shoulder blades sliding forward. Keep your elbows and your head still. °If you are not sure that you are doing this exercise correctly, ask your health care provider for more instructions. °Hold for 3 seconds. °Slowly return to the starting position. Let your muscles relax completely before you repeat this exercise. °Repeat 2 times. Complete this exercise 3 times per week. °Exercise F: Shoulder blade squeezes  °(scapular retraction) °Sit with good posture in a stable chair. Do not let your back touch the back of the chair. °Your arms should be at your sides with your elbows bent. You may rest your forearms on a pillow if that is more comfortable. °Squeeze your shoulder blades together. Bring them down and back. °Keep your shoulders level. °Do not lift your shoulders up toward your ears. °Hold for 3 seconds. °Return to the starting position. °Repeat 2 times. Complete this exercise 3 times per week. °This information is not intended to replace advice given to you by your health care provider. Make sure you discuss any questions you have with your health care provider. °Document Released: 02/14/2005 Document Revised: 11/26/2015 Document Reviewed: 11/02/2014 °[employer] Interactive Patient Education © 2018 [employer] ° °

## 2023-12-29 ENCOUNTER — Other Ambulatory Visit: Payer: Self-pay | Admitting: Family Medicine

## 2023-12-29 DIAGNOSIS — E782 Mixed hyperlipidemia: Secondary | ICD-10-CM

## 2024-01-27 ENCOUNTER — Encounter: Payer: Self-pay | Admitting: Family Medicine

## 2024-01-29 ENCOUNTER — Other Ambulatory Visit: Payer: Self-pay

## 2024-01-29 DIAGNOSIS — I10 Essential (primary) hypertension: Secondary | ICD-10-CM

## 2024-01-29 MED ORDER — METOPROLOL SUCCINATE ER 25 MG PO TB24: 25.0000 mg | ORAL_TABLET | Freq: Every day | ORAL | 2 refills | Status: AC

## 2024-01-29 MED ORDER — CHLORTHALIDONE 25 MG PO TABS: 25.0000 mg | ORAL_TABLET | Freq: Every day | ORAL | 1 refills | Status: DC

## 2024-01-29 MED ORDER — POTASSIUM CHLORIDE CRYS ER 10 MEQ PO TBCR: EXTENDED_RELEASE_TABLET | ORAL | 1 refills | Status: DC

## 2024-02-21 ENCOUNTER — Encounter: Payer: Self-pay | Admitting: Family Medicine

## 2024-02-21 DIAGNOSIS — E782 Mixed hyperlipidemia: Secondary | ICD-10-CM

## 2024-02-21 MED ORDER — FENOFIBRATE 48 MG PO TABS: 48.0000 mg | ORAL_TABLET | Freq: Every day | ORAL | 1 refills | Status: DC

## 2024-02-23 DIAGNOSIS — G4733 Obstructive sleep apnea (adult) (pediatric): Secondary | ICD-10-CM | POA: Diagnosis not present

## 2024-03-11 ENCOUNTER — Ambulatory Visit: Admitting: Family Medicine

## 2024-03-18 ENCOUNTER — Ambulatory Visit: Admitting: Family Medicine

## 2024-03-19 ENCOUNTER — Encounter: Payer: Self-pay | Admitting: Family Medicine

## 2024-03-19 ENCOUNTER — Ambulatory Visit: Payer: Self-pay | Admitting: Family Medicine

## 2024-03-19 ENCOUNTER — Ambulatory Visit: Admitting: Family Medicine

## 2024-03-19 VITALS — BP 130/80 | HR 88 | Temp 98.0°F | Resp 16 | Ht 68.0 in | Wt 302.8 lb

## 2024-03-19 DIAGNOSIS — Z23 Encounter for immunization: Secondary | ICD-10-CM | POA: Diagnosis not present

## 2024-03-19 DIAGNOSIS — I10 Essential (primary) hypertension: Secondary | ICD-10-CM | POA: Diagnosis not present

## 2024-03-19 DIAGNOSIS — E782 Mixed hyperlipidemia: Secondary | ICD-10-CM

## 2024-03-19 LAB — COMPREHENSIVE METABOLIC PANEL WITH GFR
ALT: 10 U/L (ref 3–53)
AST: 21 U/L (ref 5–37)
Albumin: 4.5 g/dL (ref 3.5–5.2)
Alkaline Phosphatase: 48 U/L (ref 39–117)
BUN: 25 mg/dL — ABNORMAL HIGH (ref 6–23)
CO2: 34 meq/L — ABNORMAL HIGH (ref 19–32)
Calcium: 9.6 mg/dL (ref 8.4–10.5)
Chloride: 102 meq/L (ref 96–112)
Creatinine, Ser: 1.02 mg/dL (ref 0.40–1.50)
GFR: 84.97 mL/min
Glucose, Bld: 90 mg/dL (ref 70–99)
Potassium: 3.6 meq/L (ref 3.5–5.1)
Sodium: 141 meq/L (ref 135–145)
Total Bilirubin: 1.5 mg/dL — ABNORMAL HIGH (ref 0.2–1.2)
Total Protein: 6.9 g/dL (ref 6.0–8.3)

## 2024-03-19 LAB — LIPID PANEL
Cholesterol: 120 mg/dL (ref 28–200)
HDL: 52.9 mg/dL
LDL Cholesterol: 46 mg/dL (ref 10–99)
NonHDL: 66.62
Total CHOL/HDL Ratio: 2
Triglycerides: 102 mg/dL (ref 10.0–149.0)
VLDL: 20.4 mg/dL (ref 0.0–40.0)

## 2024-03-19 MED ORDER — CHLORTHALIDONE 25 MG PO TABS: 25.0000 mg | ORAL_TABLET | Freq: Every day | ORAL | 3 refills | Status: AC

## 2024-03-19 MED ORDER — FENOFIBRATE 48 MG PO TABS: 48.0000 mg | ORAL_TABLET | Freq: Every day | ORAL | 3 refills | Status: AC

## 2024-03-19 MED ORDER — ROSUVASTATIN CALCIUM 10 MG PO TABS: 10.0000 mg | ORAL_TABLET | Freq: Every day | ORAL | 3 refills | Status: AC

## 2024-03-19 MED ORDER — AMLODIPINE BESYLATE 5 MG PO TABS: ORAL_TABLET | ORAL | 3 refills | Status: AC

## 2024-03-19 MED ORDER — ERYTHROMYCIN 5 MG/GM OP OINT: 1.0000 | TOPICAL_OINTMENT | Freq: Every day | OPHTHALMIC | 0 refills | Status: AC

## 2024-03-19 MED ORDER — POTASSIUM CHLORIDE CRYS ER 10 MEQ PO TBCR: EXTENDED_RELEASE_TABLET | ORAL | 3 refills | Status: AC

## 2024-03-19 NOTE — Progress Notes (Signed)
 Chief Complaint  Patient presents with   Follow-up    Follow Up    Subjective Kevin Newman is a 54 y.o. male who presents for hypertension follow up. He does monitor home blood pressures. Blood pressures ranging from 120's/70's on average. He is compliant with medications- Norvasc  5 mg, Toprol  XL 25 mg/d, chlorthalidone  25 mg/d. Patient has these side effects of medication: none He is usually adhering to a healthy diet overall. Current exercise: none No CP or SOB.   Hyperlipidemia Patient presents for mixed hyperlipidemia follow up. Currently being treated with Crestor  10 mg/d, Tricor  48 mg/d and compliance with treatment thus far has been good. He denies myalgias. Diet/exercise as above. No Cp or SOB.  The patient is not known to have coexisting coronary artery disease.   Past Medical History:  Diagnosis Date   Anxiety    Back pain    Constipation    Diverticulosis    Essential hypertension 04/22/2016   High cholesterol    Hypertension    Hypogonadism male 08/18/2016   Internal hemorrhoids    Sleep apnea    boderline, have CPAP   Swelling     Exam BP 130/80 (BP Location: Left Arm, Patient Position: Sitting)   Pulse 88   Temp 98 F (36.7 C) (Oral)   Resp 16   Ht 5' 8 (1.727 m)   Wt (!) 302 lb 12.8 oz (137.3 kg)   SpO2 96%   BMI 46.04 kg/m  General:  well developed, well nourished, in no apparent distress Heart: RRR, no bruits, no LE edema Lungs: clear to auscultation, no accessory muscle use Psych: well oriented with normal range of affect and appropriate judgment/insight  Essential hypertension - Plan: chlorthalidone  (HYGROTON ) 25 MG tablet, amLODipine  (NORVASC ) 5 MG tablet, Comprehensive metabolic panel with GFR, Lipid panel  Mixed hyperlipidemia - Plan: fenofibrate  (TRICOR ) 48 MG tablet, rosuvastatin  (CRESTOR ) 10 MG tablet  Chronic, stable. Cont Norvasc  5 mg/d, chlorthalidone  25 mg/d. Counseled on diet and exercise. Chronic, stable. Cont Tricor  48  mg/d, Crestor  10 mg/d. PCV20 today.  F/u in 6 mo. The patient voiced understanding and agreement to the plan.  Mabel Mt [place], DO 03/19/24  8:50 AM

## 2024-03-19 NOTE — Addendum Note (Signed)
 Addended by: Lex Linhares M on: 03/19/2024 09:16 AM   Modules accepted: Orders

## 2024-03-19 NOTE — Patient Instructions (Signed)
Give us 2-3 business days to get the results of your labs back.   Keep the diet clean and stay active.  Aim to do some physical exertion for 150 minutes per week. This is typically divided into 5 days per week, 30 minutes per day. The activity should be enough to get your heart rate up. Anything is better than nothing if you have time constraints.  Let us know if you need anything.  

## 2024-03-22 ENCOUNTER — Encounter: Payer: Self-pay | Admitting: Family Medicine

## 2024-04-04 ENCOUNTER — Encounter: Payer: Self-pay | Admitting: Adult Health

## 2024-04-04 ENCOUNTER — Ambulatory Visit: Admitting: Adult Health

## 2024-04-04 VITALS — BP 128/84 | HR 75 | Ht 68.0 in | Wt 302.4 lb

## 2024-04-04 DIAGNOSIS — G4733 Obstructive sleep apnea (adult) (pediatric): Secondary | ICD-10-CM

## 2024-04-04 MED ORDER — ZEPBOUND 2.5 MG/0.5ML ~~LOC~~ SOAJ: 2.5000 mg | SUBCUTANEOUS | 0 refills | Status: AC

## 2024-04-04 NOTE — Patient Instructions (Addendum)
 Continue on CPAP At bedtime   Try Resmed N30I nasal mask.  Work on healthy weight loss.  Do not drive if sleepy.   Begin Zepbound  2.5mg  weekly.  Follow up in 4 weeks and As needed  -Virtual   Notify your provider if you are planning to have a procedure/surgery, as this medication will need to be stopped prior.

## 2024-04-04 NOTE — Progress Notes (Unsigned)
 "  @Patient  ID: Kevin Newman, male    DOB: [birth year], 54 y.o.   MRN: [id number]  Chief Complaint  Patient presents with   Medical Management of Chronic Issues    [facility] F/U    Referring provider: Frann Mabel Mt, DO  HPI: 54 year old male seen for sleep consult April 2023 for daytime sleepiness found to have severe obstructive sleep apnea on nocturnal CPAP    TEST/EVENTS : Reviewed 04/04/2024  Home sleep study done on August 15, 2021 shows severe sleep apnea with AHI at 30.9/hour and SPO2 low at 75%.    Discussed the use of AI scribe software for clinical note transcription with the patient, who gave verbal consent to proceed.  History of Present Illness      Allergies[1]  Immunization History  Administered Date(s) Administered   Influenza Inj Mdck Quad Pf 12/18/2017   Influenza, Seasonal, Injecte, Preservative Fre 03/27/2023   Influenza,inj,Quad PF,6+ Mos 10/30/2015, 04/01/2020, 11/26/2021   Influenza-Unspecified 12/05/2016, 12/18/2017   PNEUMOCOCCAL CONJUGATE-20 03/19/2024   Tdap 05/24/2021   Tetanus 04/23/2011    Past Medical History:  Diagnosis Date   Anxiety    Back pain    Constipation    Diverticulosis    Essential hypertension 04/22/2016   High cholesterol    Hypertension    Hypogonadism male 08/18/2016   Internal hemorrhoids    Sleep apnea    boderline, have CPAP   Swelling     Tobacco History: Tobacco Use History[2] Counseling given: Not Answered   Outpatient Medications Prior to Visit  Medication Sig Dispense Refill   amLODipine  (NORVASC ) 5 MG tablet TAKE 1 TABLET(5 MG) BY MOUTH DAILY 90 tablet 3   azelastine  (ASTELIN ) 0.1 % nasal spray Place 1-2 sprays into both nostrils 2 (two) times daily as needed. 30 mL 5   chlorthalidone  (HYGROTON ) 25 MG tablet Take 1 tablet (25 mg total) by mouth daily. TAKE 1 TABLET(25 MG) BY MOUTH DAILY 90 tablet 3   fenofibrate  (TRICOR ) 48 MG tablet Take 1 tablet (48 mg total) by mouth daily. 90 tablet 3    Magnesium 400 MG TABS Take 400 mg by mouth 2 (two) times daily.     metoprolol  succinate (TOPROL -XL) 25 MG 24 hr tablet Take 1 tablet (25 mg total) by mouth daily. 90 tablet 2   potassium chloride  (KLOR-CON  M) 10 MEQ tablet TAKE 2 TABLETS(20 MEQ) BY MOUTH DAILY 180 tablet 3   rosuvastatin  (CRESTOR ) 10 MG tablet Take 1 tablet (10 mg total) by mouth daily. 90 tablet 3   erythromycin  ophthalmic ointment Place 1 Application into the right eye at bedtime. (Patient not taking: Reported on 04/04/2024) 3.5 g 0   loratadine (CLARITIN) 10 MG tablet  (Patient not taking: Reported on 04/04/2024)     Multiple Vitamins-Minerals (MULTIVITAMIN ADULT PO) Take by mouth. (Patient not taking: Reported on 04/04/2024)     OVER THE COUNTER MEDICATION BIOTIC DETOXIFICATION SUPPORT.  Take 1 tablet daily (Patient not taking: Reported on 04/04/2024)     No facility-administered medications prior to visit.     Review of Systems:   Constitutional:   No  weight loss, night sweats,  Fevers, chills, fatigue, or  lassitude.  HEENT:   No headaches,  Difficulty swallowing,  Tooth/dental problems, or  Sore throat,                No sneezing, itching, ear ache, nasal congestion, post nasal drip,   CV:  No chest pain,  Orthopnea, PND, swelling in lower extremities, anasarca,  dizziness, palpitations, syncope.   GI  No heartburn, indigestion, abdominal pain, nausea, vomiting, diarrhea, change in bowel habits, loss of appetite, bloody stools.   Resp: No shortness of breath with exertion or at rest.  No excess mucus, no productive cough,  No non-productive cough,  No coughing up of blood.  No change in color of mucus.  No wheezing.  No chest wall deformity  Skin: no rash or lesions.  GU: no dysuria, change in color of urine, no urgency or frequency.  No flank pain, no hematuria   MS:  No joint pain or swelling.  No decreased range of motion.  No back pain.    Physical Exam  There were no vitals taken for this visit.  GEN:  A/Ox3; pleasant , NAD, well nourished    HEENT:  /AT,  EACs-clear, TMs-wnl, NOSE-clear, THROAT-clear, no lesions, no postnasal drip or exudate noted.   NECK:  Supple w/ fair ROM; no JVD; normal carotid impulses w/o bruits; no thyromegaly or nodules palpated; no lymphadenopathy.    RESP  Clear  P & A; w/o, wheezes/ rales/ or rhonchi. no accessory muscle use, no dullness to percussion  CARD:  RRR, no m/r/g, no peripheral edema, pulses intact, no cyanosis or clubbing.  GI:   Soft & nt; nml bowel sounds; no organomegaly or masses detected.   Musco: Warm bil, no deformities or joint swelling noted.   Neuro: alert, no focal deficits noted.    Skin: Warm, no lesions or rashes    Lab Results:Reviewed 04/04/2024   CBC    Component Value Date/Time   WBC 6.6 09/26/2023 0830   RBC 5.01 09/26/2023 0830   HGB 15.4 09/26/2023 0830   HGB 15.4 09/03/2020 0931   HCT 46.2 09/26/2023 0830   HCT 46.0 09/03/2020 0931   PLT 205.0 09/26/2023 0830   PLT 224 09/03/2020 0931   MCV 92.3 09/26/2023 0830   MCV 93 09/03/2020 0931   MCH 31.2 09/03/2020 0931   MCH 32.8 11/06/2017 1526   MCHC 33.4 09/26/2023 0830   RDW 13.6 09/26/2023 0830   RDW 12.1 09/03/2020 0931   LYMPHSABS 1.8 09/03/2020 0931   MONOABS 0.8 11/06/2017 1526   EOSABS 0.1 09/03/2020 0931   BASOSABS 0.0 09/03/2020 0931    BMET    Component Value Date/Time   NA 141 03/19/2024 0910   NA 139 09/03/2020 0931   K 3.6 03/19/2024 0910   CL 102 03/19/2024 0910   CO2 34 (H) 03/19/2024 0910   GLUCOSE 90 03/19/2024 0910   BUN 25 (H) 03/19/2024 0910   BUN 18 09/03/2020 0931   CREATININE 1.02 03/19/2024 0910   CALCIUM  9.6 03/19/2024 0910   GFRNONAA >60 04/07/2016 0932   GFRAA >60 04/07/2016 0932    BNP No results found for: BNP  ProBNP No results found for: PROBNP  Imaging: No results found.  Administration History     None           No data to display          No results found for: NITRICOXIDE      No  data to display              Assessment & Plan:   Assessment and Plan Assessment & Plan         Shepard Keltz, NP 04/04/2024  I spent   minutes dedicated to the care of this patient on the date of this encounter to include pre-visit review of records, face-to-face time  with the patient discussing conditions above, post visit ordering of testing, clinical documentation with the electronic health record, making appropriate referrals as documented, and communicating necessary findings to members of the patients care team.      [1]  Allergies Allergen Reactions   Amoxil [Amoxicillin]    Sulfa Antibiotics   [2]  Social History Tobacco Use  Smoking Status Never  Smokeless Tobacco Never   "

## 2024-05-10 ENCOUNTER — Ambulatory Visit: Admitting: Adult Health

## 2024-09-27 ENCOUNTER — Encounter: Admitting: Family Medicine
